# Patient Record
Sex: Female | Born: 1955 | Race: White | Hispanic: No | Marital: Married | Smoking: Never smoker
Health system: Southern US, Community
[De-identification: ages and names within clinical notes are randomized; demographics above are authoritative.]

## PROBLEM LIST (undated history)

## (undated) DIAGNOSIS — N951 Menopausal and female climacteric states: Secondary | ICD-10-CM

## (undated) DIAGNOSIS — K219 Gastro-esophageal reflux disease without esophagitis: Secondary | ICD-10-CM

## (undated) DIAGNOSIS — R030 Elevated blood-pressure reading, without diagnosis of hypertension: Secondary | ICD-10-CM

## (undated) DIAGNOSIS — D649 Anemia, unspecified: Secondary | ICD-10-CM

## (undated) DIAGNOSIS — E785 Hyperlipidemia, unspecified: Secondary | ICD-10-CM

## (undated) DIAGNOSIS — C44310 Basal cell carcinoma of skin of unspecified parts of face: Secondary | ICD-10-CM

## (undated) DIAGNOSIS — Z0000 Encounter for general adult medical examination without abnormal findings: Secondary | ICD-10-CM

## (undated) DIAGNOSIS — R197 Diarrhea, unspecified: Secondary | ICD-10-CM

## (undated) DIAGNOSIS — B019 Varicella without complication: Secondary | ICD-10-CM

## (undated) DIAGNOSIS — R002 Palpitations: Secondary | ICD-10-CM

## (undated) HISTORY — DX: Gastro-esophageal reflux disease without esophagitis: K21.9

## (undated) HISTORY — DX: Elevated blood-pressure reading, without diagnosis of hypertension: R03.0

## (undated) HISTORY — DX: Menopausal and female climacteric states: N95.1

## (undated) HISTORY — DX: Hyperlipidemia, unspecified: E78.5

## (undated) HISTORY — DX: Varicella without complication: B01.9

## (undated) HISTORY — DX: Diarrhea, unspecified: R19.7

## (undated) HISTORY — PX: SKIN CANCER EXCISION: SHX779

## (undated) HISTORY — PX: WISDOM TOOTH EXTRACTION: SHX21

## (undated) HISTORY — PX: TONSILLECTOMY: SHX5217

## (undated) HISTORY — DX: Encounter for general adult medical examination without abnormal findings: Z00.00

## (undated) HISTORY — DX: Palpitations: R00.2

## (undated) HISTORY — DX: Basal cell carcinoma of skin of unspecified parts of face: C44.310

---

## 1999-08-22 ENCOUNTER — Encounter: Admission: RE | Admit: 1999-08-22 | Discharge: 1999-08-22 | Payer: Self-pay | Admitting: Family Medicine

## 1999-08-22 ENCOUNTER — Encounter: Payer: Self-pay | Admitting: Family Medicine

## 2001-10-14 DIAGNOSIS — E785 Hyperlipidemia, unspecified: Secondary | ICD-10-CM

## 2001-10-14 HISTORY — DX: Hyperlipidemia, unspecified: E78.5

## 2002-10-14 DIAGNOSIS — D649 Anemia, unspecified: Secondary | ICD-10-CM

## 2002-10-14 HISTORY — DX: Anemia, unspecified: D64.9

## 2003-09-19 ENCOUNTER — Ambulatory Visit (HOSPITAL_COMMUNITY): Admission: RE | Admit: 2003-09-19 | Discharge: 2003-09-19 | Payer: Self-pay | Admitting: Gastroenterology

## 2003-10-15 LAB — HM COLONOSCOPY: HM Colonoscopy: NORMAL

## 2003-11-01 ENCOUNTER — Ambulatory Visit (HOSPITAL_COMMUNITY): Admission: RE | Admit: 2003-11-01 | Discharge: 2003-11-01 | Payer: Self-pay | Admitting: Gastroenterology

## 2003-11-16 ENCOUNTER — Encounter: Admission: RE | Admit: 2003-11-16 | Discharge: 2003-11-16 | Payer: Self-pay | Admitting: Gastroenterology

## 2004-11-22 ENCOUNTER — Ambulatory Visit (HOSPITAL_COMMUNITY): Admission: RE | Admit: 2004-11-22 | Discharge: 2004-11-22 | Payer: Self-pay | Admitting: Plastic Surgery

## 2004-11-22 ENCOUNTER — Ambulatory Visit (HOSPITAL_BASED_OUTPATIENT_CLINIC_OR_DEPARTMENT_OTHER): Admission: RE | Admit: 2004-11-22 | Discharge: 2004-11-22 | Payer: Self-pay | Admitting: Plastic Surgery

## 2008-06-14 ENCOUNTER — Ambulatory Visit: Payer: Self-pay | Admitting: Hematology

## 2009-08-01 ENCOUNTER — Encounter (HOSPITAL_COMMUNITY): Admission: RE | Admit: 2009-08-01 | Discharge: 2009-10-13 | Payer: Self-pay | Admitting: Family Medicine

## 2011-03-01 NOTE — Op Note (Signed)
NAMECHEMERE, STEFFLER          ACCOUNT NO.:  [id number]   MEDICAL RECORD NO.:  [id number]          PATIENT TYPE:  AMB   LOCATION:  DSC                          FACILITY:  [facility]   PHYSICIAN:  Alfredia Ferguson, M.D.  DATE OF BIRTH:  Jul 08, 1956   DATE OF PROCEDURE:  11/22/2004  DATE OF DISCHARGE:                                 OPERATIVE REPORT   PREOPERATIVE DIAGNOSIS:  Excess fat of the submental area of neck and  bilateral neck.   POSTOPERATIVE DIAGNOSIS:  Excess fat of the submental area of neck and  bilateral neck.   OPERATION PERFORMED:  Suction assisted lipectomy, removing approximately 60  grams of fat.   SURGEON.:  W. Delia Chimes, M.D.   ANESTHESIA:  MAC supplemented with local anesthesia.   INDICATIONS FOR SURGERY:  This is a 55 year old woman who complains of  excess fat in the submental area. She also has some accumulated fat in both  lateral necks. The patient wishes to undergo suction assisted lipectomy.  She was counseled regarding the potential risk of surgery including  temporary or permanent nerve damage, especially to the marginal mandibular  nerve, bleeding, infection, asymmetry, rippling, dimpling, wrinkling of the  skin, nontightening of the skin following the surgery, residual fat, the  need for secondary surgery, temporary numbness of the skin and overall  dissatisfaction with results.  In spite of these and other risks. The  patient wishes to proceed with this surgery. She understands the limitations  of suction assisted lipectomy in that it will not restore her neck to one  that she had when she was very young.   DESCRIPTION OF SURGERY:  Skin marks were placed outlining the limits of  suction in the neck. The patient was then taken to the OR where she was  given intravenous analgesia.  A tumescent solution was infiltrated into the  left and right side of the neck and submental area. This solution consisted  of 250 cc of normal saline plus 1 ampule  of epinephrine 1:1000, plus 50 cc  of 1% Xylocaine plain. A total of 120 cc of this solution was used as the  tumescent solution. The patient's face and neck were then prepped with  Betadine and draped with sterile drapes. After waiting approximately 15  minutes following injection, the neck and submental area were pretunneled  without suction using a 3-mm flat cannula single hole. After pretunneling,  suction was applied and the submental area was suctioned first followed by  the right neck, then the left neck.  Suction went without complication and  with essentially no bleeding. Following the suction, symmetry appeared to be  good and correction of the patient's problem appeared to have been achieved.  Each access incision was closed with a single 5-0 nylon suture. The  patient's face and neck were now cleansed and dried.  [place] foam was placed  on the undersurface of the neck for compression followed by circumferential  wrap around the head and neck. The patient was transferred to recovery room  in satisfactory condition.    WBB/MEDQ  D:  11/22/2004  T:  11/22/2004  Job:  [id number] 

## 2011-03-01 NOTE — Op Note (Signed)
[name], Toni Daniel                    ACCOUNT NO.:  [id number]   MEDICAL RECORD NO.:  [id number]                   PATIENT TYPE:  AMB   LOCATION:  ENDO                                 FACILITY:  [facility]   PHYSICIAN:  Anselmo Rod, M.D.               DATE OF BIRTH:  [birth year]   DATE OF PROCEDURE:  09/19/2003  DATE OF DISCHARGE:                                 OPERATIVE REPORT   PROCEDURE:  Esophagogastroduodenoscopy.   ENDOSCOPIST:  Anselmo Rod, M.D.   INSTRUMENT USED:  Olympus video panendoscope.   INDICATIONS FOR PROCEDURE:  A 55 year old white female with a history of  dysphagia. A barium swallow showed a mild distal esophageal  stricture  versus esophagitis. Dilatation planned if needed.   PREPROCEDURE PREPARATION:  Informed consent was obtained from the patient.  The patient was fasted for 8 hours prior to the procedure.   PREPROCEDURE PHYSICAL:  The patient had stable vital signs, neck supple,  chest clear to auscultation, S1, S2 regular, abdomen soft with normal bowel  sounds.   DESCRIPTION OF PROCEDURE:  The patient was placed in the left lateral  decubitus position and sedated with 70 mg of Demerol, and 7 mg of Versed  intravenously. Once the patient was adequately sedated and maintained on low  flow oxygen and continuous cardiac monitoring, the Olympus video  panendoscope was advanced through the mouth piece over the tongue into the  esophagus under direct visualization.   The entire esophagus appeared normal. The GU junction was widely patent. No  erosions, ulcerations, Barrett's mucosa, Schatzki's ring or stricture were  seen.  The scope was then advanced into the stomach. The entire gastric  mucosa appeared healthy. The retroflex view showed no abnormalities. The  proximal small bowel was normal.   IMPRESSION:  Normal esophagogastroduodenoscopy, no stricture seen.   RECOMMENDATIONS:  1. Continue proton pump inhibitors.  2. Follow anti reflux  measures.  3. Outpatient follow up in the next 4 weeks for further recommendations.                                               Anselmo Rod, M.D.    JNM/MEDQ  D:  09/19/2003  T:  09/19/2003  Job:  [id number]   cc:   Pershing Cox, M.D.  [address]  [address]  [place]  [place] [address]  Fax: [phone]

## 2011-05-15 LAB — HM PAP SMEAR: HM Pap smear: NORMAL

## 2011-06-29 ENCOUNTER — Encounter: Payer: Self-pay | Admitting: *Deleted

## 2011-06-29 ENCOUNTER — Emergency Department (HOSPITAL_BASED_OUTPATIENT_CLINIC_OR_DEPARTMENT_OTHER)
Admission: EM | Admit: 2011-06-29 | Discharge: 2011-06-29 | Disposition: A | Discharge status: Home / Self Care | Payer: 59 | Attending: Emergency Medicine | Admitting: Emergency Medicine

## 2011-06-29 DIAGNOSIS — D509 Iron deficiency anemia, unspecified: Secondary | ICD-10-CM | POA: Insufficient documentation

## 2011-06-29 HISTORY — DX: Anemia, unspecified: D64.9

## 2011-06-29 LAB — CBC
HCT: 29.2 % — ABNORMAL LOW (ref 36.0–46.0)
Hemoglobin: 8.9 g/dL — ABNORMAL LOW (ref 12.0–15.0)
MCH: 22.3 pg — ABNORMAL LOW (ref 26.0–34.0)
MCHC: 30.5 g/dL (ref 30.0–36.0)
MCV: 73 fL — ABNORMAL LOW (ref 78.0–100.0)
Platelets: 197 10*3/uL (ref 150–400)
RBC: 4 MIL/uL (ref 3.87–5.11)
RDW: 16.3 % — ABNORMAL HIGH (ref 11.5–15.5)
WBC: 5.4 10*3/uL (ref 4.0–10.5)

## 2011-06-29 LAB — DIFFERENTIAL
Eosinophils Relative: 2 % (ref 0–5)
Lymphocytes Relative: 33 % (ref 12–46)
Lymphs Abs: 1.8 10*3/uL (ref 0.7–4.0)
Monocytes Absolute: 0.6 10*3/uL (ref 0.1–1.0)
Neutro Abs: 2.9 10*3/uL (ref 1.7–7.7)

## 2011-06-29 LAB — BASIC METABOLIC PANEL
BUN: 11 mg/dL (ref 6–23)
Chloride: 103 mEq/L (ref 96–112)
GFR calc Af Amer: >60 mL/min (ref >60)
GFR calc non Af Amer: >60 mL/min (ref >60)
Potassium: 3.6 mEq/L (ref 3.5–5.1)
Sodium: 139 mEq/L (ref 135–145)

## 2011-06-29 MED ORDER — FERROUS SULFATE 325 (65 FE) MG PO TABS: 325.0000 mg | ORAL_TABLET | Freq: Three times a day (TID) | ORAL | Status: DC

## 2011-06-29 NOTE — ED Notes (Signed)
I called [facility] , MD on call for Dr. Foy Guadalajara. Dr. Renato Gails on call, my initial call was made at 821p, I re paged again at 843 p, and again at 906 p. The call was returned at 922 p by Dr. Renato Gails to Dr. Brooke Dare

## 2011-06-29 NOTE — ED Notes (Signed)
Pt states she was seen at the Dr's office last week and her Hgb was ?9.7 "They wanted to do more tests." Here today because her s/s are worse. Feel dizzy and tired. Extremities feel heavy. C/O H/A.

## 2011-06-29 NOTE — ED Notes (Signed)
Pt states that she was recently seen at her PCP and had a low iron count and has previously had iron transfusions pt reports dizziness tiredness and general malaise x several weeks

## 2011-06-29 NOTE — ED Provider Notes (Signed)
Scribed for Toni Bailiff, MD, the patient was seen in room MH03/MH03 . This chart was scribed by Toni Daniel. This patient's care was started at 7:35 PM.   CSN: [id number] Arrival date & time: 06/29/2011  6:45 PM   Chief Complaint  Patient presents with  . Anemia   (Include location/radiation/quality/duration/timing/severity/associated sxs/prior treatment) HPI Toni Daniel is a 55 y.o. female with a history of anemia presents to the Emergency Department complaining of tiredness and dizziness. Pt reports for the past week she has felt progressively worse with sx of weakness, tiredness, dizziness, HA, and seeing "sparkles."  Pt describes dizziness as feeling like the floor tilts as she walks and the weakness/tiredness as a heaviness in her legs. Pt reports a hist of similar sx when her iron has been low. Pt reports improvement with iron transfusion and has received 2 previous transfusions in 2007 and 2010.  Denies bloody stools.  Currently takes iron supplements but reports no improvement with supplements.  HPI ELEMENTS:  Onset: 1 week ago Timing: intermittent  Quality: "like the flor tilts as she walks"  Modifying factors: aggravated by change in position  Context:  as above  Associated symptoms: HA,, seeing "sparkles, weakness  Past Medical History  Diagnosis Date  . Anemia   . Migraine    History reviewed. No pertinent past surgical history.  History reviewed. No pertinent family history.  History  Substance Use Topics  . Smoking status: Never Smoker   . Smokeless tobacco: Not on file  . Alcohol Use: Yes    Review of Systems 10 Systems reviewed and are negative for acute change except as noted in the HPI.  Allergies  Latex  Home Medications   Current Outpatient Rx  Name Route Sig Dispense Refill  . ATORVASTATIN CALCIUM 80 MG PO TABS Oral Take 80 mg by mouth daily.      Marland Kitchen FERROUS SULFATE 325 (65 FE) MG PO TABS Oral Take 325 mg by mouth daily with breakfast.       . LANSOPRAZOLE 15 MG PO CPDR Oral Take 15 mg by mouth daily.      Marland Kitchen ONE-DAILY MULTI VITAMINS PO TABS Oral Take 1 tablet by mouth 2 (two) times a week.        Physical Exam    BP 155/84  Pulse 80  Temp(Src) 98.1 F (36.7 C) (Oral)  Resp 20  Ht 5\' 5"  (1.651 m)  Wt 139 lb (63.05 kg)  BMI 23.13 kg/m2  SpO2 100%  LMP 06/05/2011  Physical Exam  Nursing note and vitals reviewed. Constitutional: She is oriented to person, place, and time. She appears well-developed and well-nourished. She appears distressed.  HENT:  Head: Normocephalic and atraumatic.  Eyes: Conjunctivae and EOM are normal. Pupils are equal, round, and reactive to light.  Neck: Normal range of motion. Neck supple.  Cardiovascular: Normal rate, regular rhythm and normal heart sounds.   Pulmonary/Chest: Effort normal and breath sounds normal.  Abdominal: Soft. There is no tenderness.  Musculoskeletal: Normal range of motion.  Neurological: She is alert and oriented to person, place, and time.  Skin: Skin is warm and dry.  Psychiatric: She has a normal mood and affect.   Procedures  OTHER DATA REVIEWED: Nursing notes, vital signs, and past medical records reviewed.  DIAGNOSTIC STUDIES: Oxygen Saturation is 100% on room air, normal by my interpretation.    LABS / RADIOLOGY:  Results for orders placed during the hospital encounter of 06/29/11  CBC  Component Value Range   WBC 5.4  4.0 - 10.5 (K/uL)   RBC 4.00  3.87 - 5.11 (MIL/uL)   Hemoglobin 8.9 (*) 12.0 - 15.0 (g/dL)   HCT 16.1 (*) 09.6 - 46.0 (%)   MCV 73.0 (*) 78.0 - 100.0 (fL)   MCH 22.3 (*) 26.0 - 34.0 (pg)   MCHC 30.5  30.0 - 36.0 (g/dL)   RDW 04.5 (*) 40.9 - 15.5 (%)   Platelets 197  150 - 400 (K/uL)  DIFFERENTIAL      Component Value Range   Neutrophils Relative 54  43 - 77 (%)   Neutro Abs 2.9  1.7 - 7.7 (K/uL)   Lymphocytes Relative 33  12 - 46 (%)   Lymphs Abs 1.8  0.7 - 4.0 (K/uL)   Monocytes Relative 11  3 - 12 (%)   Monocytes  Absolute 0.6  0.1 - 1.0 (K/uL)   Eosinophils Relative 2  0 - 5 (%)   Eosinophils Absolute 0.1  0.0 - 0.7 (K/uL)   Basophils Relative 0  0 - 1 (%)   Basophils Absolute 0.0  0.0 - 0.1 (K/uL)    ED COURSE / COORDINATION OF CARE: 20:10 EDP at PT bedside. Discussed diagnostic possibilities including low iron and viral infection. Pt requests iron transfusion. EDP discussed availability issues with a non standard procedure such as iron infusions. Discussed plan to discharge with follow/up instructions. EDP ordered the following: Orders Placed This Encounter  Procedures  . CBC  . Differential  . Basic metabolic panel  21:25 EDP consult with Dr. Azucena Kuba.  21:27 Pt recehck.   MDM: CBC and basic metabolic panel was obtained numerous department. Her hemoglobin is 8.9. This is not well enough to require a transfusion. She has a microcytic anemia likely secondary to her history of iron deficiency anemia. Patient states that when she has had symptoms this severe she has required iron infusion. We're unable to do that at this facility. I increased her dosing of by mouth iron. I discussed her case with the on-call physician for her primary care group. I explained her need for an INR infusion it was decided the patient does not meet criteria for admission. They will arrange an appointment for Monday and seek to arrange infusion for early in the week. Patient provided signs and symptoms for which to return to the emergency department   SCRIBE ATTESTATION: I personally performed the services described in this documentation, which was scribed in my presence. The recorded information has been reviewed and considered.          Toni Bailiff, MD 06/29/11 2205

## 2011-07-05 ENCOUNTER — Ambulatory Visit (HOSPITAL_COMMUNITY): Payer: 59 | Attending: Family Medicine

## 2011-07-05 DIAGNOSIS — D649 Anemia, unspecified: Secondary | ICD-10-CM | POA: Insufficient documentation

## 2011-07-23 ENCOUNTER — Ambulatory Visit: Payer: 59 | Admitting: Hematology & Oncology

## 2011-08-12 ENCOUNTER — Ambulatory Visit: Payer: 59 | Admitting: Hematology & Oncology

## 2011-08-22 ENCOUNTER — Telehealth: Payer: Self-pay | Admitting: Hematology & Oncology

## 2011-08-22 NOTE — Telephone Encounter (Signed)
Talked with Dois Davenport at Dr. Pablo Lawrence office is aware pt was a no show for 10-29 appointment and has not called back to reschedule per note from [facility]

## 2012-05-14 LAB — HM MAMMOGRAPHY: HM Mammogram: NORMAL

## 2012-07-20 ENCOUNTER — Ambulatory Visit (INDEPENDENT_AMBULATORY_CARE_PROVIDER_SITE_OTHER): Payer: BC Managed Care – PPO | Admitting: Family Medicine

## 2012-07-20 ENCOUNTER — Encounter: Payer: Self-pay | Admitting: Family Medicine

## 2012-07-20 VITALS — BP 138/84 | HR 72 | Temp 97.8°F | Ht 65.0 in | Wt 154.8 lb

## 2012-07-20 DIAGNOSIS — K219 Gastro-esophageal reflux disease without esophagitis: Secondary | ICD-10-CM

## 2012-07-20 DIAGNOSIS — R030 Elevated blood-pressure reading, without diagnosis of hypertension: Secondary | ICD-10-CM

## 2012-07-20 DIAGNOSIS — IMO0001 Reserved for inherently not codable concepts without codable children: Secondary | ICD-10-CM

## 2012-07-20 DIAGNOSIS — C44319 Basal cell carcinoma of skin of other parts of face: Secondary | ICD-10-CM

## 2012-07-20 DIAGNOSIS — G43909 Migraine, unspecified, not intractable, without status migrainosus: Secondary | ICD-10-CM

## 2012-07-20 DIAGNOSIS — R197 Diarrhea, unspecified: Secondary | ICD-10-CM

## 2012-07-20 DIAGNOSIS — N951 Menopausal and female climacteric states: Secondary | ICD-10-CM

## 2012-07-20 DIAGNOSIS — E785 Hyperlipidemia, unspecified: Secondary | ICD-10-CM | POA: Insufficient documentation

## 2012-07-20 DIAGNOSIS — R1310 Dysphagia, unspecified: Secondary | ICD-10-CM

## 2012-07-20 DIAGNOSIS — R002 Palpitations: Secondary | ICD-10-CM

## 2012-07-20 DIAGNOSIS — D649 Anemia, unspecified: Secondary | ICD-10-CM

## 2012-07-20 DIAGNOSIS — C44310 Basal cell carcinoma of skin of unspecified parts of face: Secondary | ICD-10-CM

## 2012-07-20 HISTORY — DX: Diarrhea, unspecified: R19.7

## 2012-07-20 HISTORY — DX: Reserved for inherently not codable concepts without codable children: IMO0001

## 2012-07-20 LAB — RENAL FUNCTION PANEL
Albumin: 3.9 g/dL (ref 3.5–5.2)
BUN: 15 mg/dL (ref 6–23)
CO2: 29 mEq/L (ref 19–32)
Calcium: 9 mg/dL (ref 8.4–10.5)
Chloride: 104 mEq/L (ref 96–112)
Creatinine, Ser: 0.6 mg/dL (ref 0.4–1.2)

## 2012-07-20 LAB — IRON AND TIBC
%SAT: 24 % (ref 20–55)
Iron: 90 ug/dL (ref 42–145)

## 2012-07-20 LAB — HEPATIC FUNCTION PANEL
ALT: 22 U/L (ref 0–35)
AST: 22 U/L (ref 0–37)
Albumin: 3.9 g/dL (ref 3.5–5.2)
Alkaline Phosphatase: 66 U/L (ref 39–117)

## 2012-07-20 LAB — CBC
HCT: 41.1 % (ref 36.0–46.0)
Hemoglobin: 13.4 g/dL (ref 12.0–15.0)
Platelets: 163 10*3/uL (ref 150.0–400.0)
RBC: 4.52 Mil/uL (ref 3.87–5.11)
WBC: 4.6 10*3/uL (ref 4.5–10.5)

## 2012-07-20 LAB — LIPID PANEL
Cholesterol: 192 mg/dL (ref 0–200)
VLDL: 26.6 mg/dL (ref 0.0–40.0)

## 2012-07-20 LAB — TSH: TSH: 0.99 u[IU]/mL (ref 0.35–5.50)

## 2012-07-20 MED ORDER — ATORVASTATIN CALCIUM 80 MG PO TABS: 80.0000 mg | ORAL_TABLET | Freq: Every day | ORAL | Status: DC

## 2012-07-20 MED ORDER — RANITIDINE HCL 300 MG PO TABS: 300.0000 mg | ORAL_TABLET | Freq: Every day | ORAL | Status: DC

## 2012-07-20 NOTE — Assessment & Plan Note (Addendum)
Has switched back from OTC Prevacid 15 mg back to the Nexium 40 mg that works better but she still has breakthrough symptoms, avoid offending foods, add Ranitidine 300 mg po qhs. Referred to GI for further evaluation

## 2012-07-20 NOTE — Assessment & Plan Note (Signed)
Improved on recheck, avoid sodium consider DASH diet. Recheck at next visit.

## 2012-07-20 NOTE — Assessment & Plan Note (Signed)
Needs dermatologist agrees to referral at her next visit. Encouraged sunscreen

## 2012-07-20 NOTE — Progress Notes (Signed)
Patient ID: Toni Daniel, female   DOB: [birth year], 56 y.o.   MRN: [id number] Toni Daniel [id number] 08-10-1956 07/20/2012      Progress Note-Follow Up  Subjective  Chief Complaint  Chief Complaint  Patient presents with  . Establish Care    new patient- would like labs done- fasting    HPI  Patient is a 56 year old Caucasian female who is in today for new patient appointment. She is struggling with persistent dysphagia and heartburn. She's had trouble for many years and eventually have an upper endoscopy in 2003. She notes little catch intermittently while eating. She does take Nexium which she has just restarted was using Prevacid but that was working as well. She is struggling with perimenopausal symptoms with hot flashes, palpitations and fatigue. She is also frustrated with weight gain and fuzzy thinking. She is following with her OB/GYN and contemplating trying hormones. She struggles palpitations intermittently and describes a fluttering sensation and a small cough in her throat. This has not worsened recently and they do not last for any extended period of time. She has no associated shortness or breath, chest pain or other concerns are that. She has a long history of recurrent anemia requiring iron transfusions at times. She reports she's had 2 was a long and 1 to a study at [facility]. She was having a lot more trouble with his/her when she was having heavy periods but now she's begun skipping. She is feeling better. She did have pica but that has resolved. No recent illness, fevers, chills, chest pain, GI or GU complaints otherwise noted  Past Medical History  Diagnosis Date  . Migraine   . Chicken pox as a child  . Anemia 2004  . Hyperlipidemia 2003  . GERD (gastroesophageal reflux disease)   . Perimenopause   . Heart palpitations   . Diarrhea 07/20/2012  . BCC (basal cell carcinoma), face   . Elevated BP 07/20/2012    Past Surgical History  Procedure Date  .  Cesarean section 1986 and 1990  . Tonsillectomy 16  . Wisdom tooth extraction 56 yrs old  . Skin cancer excision     2 BCC from face    Family History  Problem Relation Age of Onset  . Stroke Mother     post- operative- stroke  . Other Mother     mass in uterus- unsure if ca  . Hyperlipidemia Mother   . Heart disease Father   . COPD Father     smoked when he was young- hadn't smoked in 40 yrs  . Hyperlipidemia Sister   . Heart disease Sister   . Cancer Maternal Grandmother     gall bladder  . Pneumonia Paternal Grandfather   . Cancer Sister     breast or uterine?  Marland Kitchen Asthma Son     History   Social History  . Marital Status: Married    Spouse Name: N/A    Number of Children: N/A  . Years of Education: N/A   Occupational History  . Not on file.   Social History Main Topics  . Smoking status: Never Smoker   . Smokeless tobacco: Never Used  . Alcohol Use: Yes     3-4 glasses of wine weekly  . Drug Use: No  . Sexually Active: Yes -- Female partner(s)   Other Topics Concern  . Not on file   Social History Narrative  . No narrative on file    Current Outpatient Prescriptions on File  Prior to Visit  Medication Sig Dispense Refill  . esomeprazole (NEXIUM) 40 MG capsule Take 40 mg by mouth daily before breakfast.      . ferrous sulfate 325 (65 FE) MG tablet Take 325 mg by mouth daily with breakfast.        . lansoprazole (PREVACID) 15 MG capsule Take 15 mg by mouth as needed.       . Multiple Vitamin (MULTIVITAMIN) tablet Take 1 tablet by mouth 2 (two) times a week.        Marland Kitchen DISCONTD: atorvastatin (LIPITOR) 80 MG tablet Take 80 mg by mouth daily.        . ferrous sulfate 325 (65 FE) MG tablet Take 1 tablet (325 mg total) by mouth 3 (three) times daily with meals.  30 tablet  0  . ranitidine (ZANTAC) 300 MG tablet Take 1 tablet (300 mg total) by mouth at bedtime.  30 tablet  5  . SUMAtriptan (IMITREX) 50 MG tablet Take 50 mg by mouth every 2 (two) hours as needed.         Allergies  Allergen Reactions  . Latex Rash    Review of Systems  Review of Systems  Constitutional: Negative for fever and malaise/fatigue.  HENT: Negative for congestion.   Eyes: Negative for discharge.  Respiratory: Positive for cough. Negative for shortness of breath.   Cardiovascular: Negative for chest pain, palpitations and leg swelling.  Gastrointestinal: Positive for heartburn. Negative for nausea, vomiting, abdominal pain and diarrhea.  Genitourinary: Negative for dysuria.  Musculoskeletal: Negative for falls.  Skin: Negative for rash.  Neurological: Positive for headaches. Negative for loss of consciousness.  Endo/Heme/Allergies: Negative for polydipsia.  Psychiatric/Behavioral: Negative for depression and suicidal ideas. The patient is not nervous/anxious and does not have insomnia.     Objective  BP 138/84  Pulse 72  Temp 97.8 F (36.6 C) (Temporal)  Ht 5\' 5"  (1.651 m)  Wt 154 lb 12.8 oz (70.217 kg)  BMI 25.76 kg/m2  SpO2 98%  LMP 05/14/2012  Physical Exam  Physical Exam  Constitutional: She is oriented to person, place, and time and well-developed, well-nourished, and in no distress. No distress.  HENT:  Head: Normocephalic and atraumatic.  Eyes: Conjunctivae normal are normal.  Neck: Neck supple. No thyromegaly present.  Cardiovascular: Normal rate, regular rhythm and normal heart sounds.   No murmur heard. Pulmonary/Chest: Effort normal and breath sounds normal. She has no wheezes.  Abdominal: Soft. Bowel sounds are normal. She exhibits no distension and no mass. There is no tenderness. There is no rebound and no guarding.  Musculoskeletal: She exhibits no edema.  Lymphadenopathy:    She has no cervical adenopathy.  Neurological: She is alert and oriented to person, place, and time.  Skin: Skin is warm and dry. No rash noted. She is not diaphoretic.  Psychiatric: Memory, affect and judgment normal.    Lab Results  Component Value Date    TSH 0.99 07/20/2012   Lab Results  Component Value Date   WBC 4.6 07/20/2012   HGB 13.4 07/20/2012   HCT 41.1 07/20/2012   MCV 91.0 07/20/2012   PLT 163.0 07/20/2012   Lab Results  Component Value Date   CREATININE 0.6 07/20/2012   BUN 15 07/20/2012   NA 139 07/20/2012   K 4.3 07/20/2012   CL 104 07/20/2012   CO2 29 07/20/2012   Lab Results  Component Value Date   ALT 22 07/20/2012   AST 22 07/20/2012   ALKPHOS 66  07/20/2012   BILITOT 0.7 07/20/2012   Lab Results  Component Value Date   CHOL 192 07/20/2012   Lab Results  Component Value Date   HDL 49.80 07/20/2012   Lab Results  Component Value Date   LDLCALC 116* 07/20/2012   Lab Results  Component Value Date   TRIG 133.0 07/20/2012   Lab Results  Component Value Date   CHOLHDL 4 07/20/2012     Assessment & Plan  Heart palpitations Avoid caffeine and report if symptoms worsen, check Thyroid  GERD (gastroesophageal reflux disease) Has switched back from OTC Prevacid 15 mg back to the Nexium 40 mg that works better but she still has breakthrough symptoms, avoid offending foods, add Ranitidine 300 mg po qhs.   Elevated BP Improved on recheck, avoid sodium consider DASH diet. Recheck at next visit.  Anemia Patient reports 3 separate episodes of iron infusions in past. Repeat cbc, iron studies. She was having menstrual cycles at that time. No longer having cycles.   BCC (basal cell carcinoma), face Needs dermatologist agrees to referral at her next visit. Encouraged sunscreen  Diarrhea Start probiotic and consider further testing if persists  Hyperlipidemia Check lipid panel, add MegaRed  Migraine Using Imitrex prn

## 2012-07-20 NOTE — Assessment & Plan Note (Signed)
Using Imitrex prn

## 2012-07-20 NOTE — Assessment & Plan Note (Signed)
Start probiotic and consider further testing if persists

## 2012-07-20 NOTE — Assessment & Plan Note (Signed)
Patient reports 3 separate episodes of iron infusions in past. Repeat cbc, iron studies. She was having menstrual cycles at that time. No longer having cycles.

## 2012-07-20 NOTE — Assessment & Plan Note (Signed)
Check lipid panel, add MegaRed

## 2012-07-20 NOTE — Assessment & Plan Note (Addendum)
Avoid caffeine and report if symptoms worsen, check Thyroid. EKG normal today

## 2012-07-20 NOTE — Patient Instructions (Addendum)
Preventive Care for Adults, Female A healthy lifestyle and preventive care can promote health and wellness. Preventive health guidelines for women include the following key practices.  A routine yearly physical is a good way to check with your caregiver about your health and preventive screening. It is a chance to share any concerns and updates on your health, and to receive a thorough exam.  Visit your dentist for a routine exam and preventive care every 6 months. Brush your teeth twice a day and floss once a day. Good oral hygiene prevents tooth decay and gum disease.  The frequency of eye exams is based on your age, health, family medical history, use of contact lenses, and other factors. Follow your caregiver's recommendations for frequency of eye exams.  Eat a healthy diet. Foods like vegetables, fruits, whole grains, low-fat dairy products, and lean protein foods contain the nutrients you need without too many calories. Decrease your intake of foods high in solid fats, added sugars, and salt. Eat the right amount of calories for you.Get information about a proper diet from your caregiver, if necessary.  Regular physical exercise is one of the most important things you can do for your health. Most adults should get at least 150 minutes of moderate-intensity exercise (any activity that increases your heart rate and causes you to sweat) each week. In addition, most adults need muscle-strengthening exercises on 2 or more days a week.  Maintain a healthy weight. The body mass index (BMI) is a screening tool to identify possible weight problems. It provides an estimate of body fat based on height and weight. Your caregiver can help determine your BMI, and can help you achieve or maintain a healthy weight.For adults 20 years and older:  A BMI below 18.5 is considered underweight.  A BMI of 18.5 to 24.9 is normal.  A BMI of 25 to 29.9 is considered overweight.  A BMI of 30 and above is  considered obese.  Maintain normal blood lipids and cholesterol levels by exercising and minimizing your intake of saturated fat. Eat a balanced diet with plenty of fruit and vegetables. Blood tests for lipids and cholesterol should begin at age 20 and be repeated every 5 years. If your lipid or cholesterol levels are high, you are over 50, or you are at high risk for heart disease, you may need your cholesterol levels checked more frequently.Ongoing high lipid and cholesterol levels should be treated with medicines if diet and exercise are not effective.  If you smoke, find out from your caregiver how to quit. If you do not use tobacco, do not start.  If you are pregnant, do not drink alcohol. If you are breastfeeding, be very cautious about drinking alcohol. If you are not pregnant and choose to drink alcohol, do not exceed 1 drink per day. One drink is considered to be 12 ounces (355 mL) of beer, 5 ounces (148 mL) of wine, or 1.5 ounces (44 mL) of liquor.  Avoid use of street drugs. Do not share needles with anyone. Ask for help if you need support or instructions about stopping the use of drugs.  High blood pressure causes heart disease and increases the risk of stroke. Your blood pressure should be checked at least every 1 to 2 years. Ongoing high blood pressure should be treated with medicines if weight loss and exercise are not effective.  If you are 55 to 58 years old, ask your caregiver if you should take aspirin to prevent strokes.  Diabetes   screening involves taking a blood sample to check your fasting blood sugar level. This should be done once every 3 years, after age 45, if you are within normal weight and without risk factors for diabetes. Testing should be considered at a younger age or be carried out more frequently if you are overweight and have at least 1 risk factor for diabetes.  Breast cancer screening is essential preventive care for women. You should practice "breast  self-awareness." This means understanding the normal appearance and feel of your breasts and may include breast self-examination. Any changes detected, no matter how small, should be reported to a caregiver. Women in their 20s and 30s should have a clinical breast exam (CBE) by a caregiver as part of a regular health exam every 1 to 3 years. After age 40, women should have a CBE every year. Starting at age 40, women should consider having a mammography (breast X-ray test) every year. Women who have a family history of breast cancer should talk to their caregiver about genetic screening. Women at a high risk of breast cancer should talk to their caregivers about having magnetic resonance imaging (MRI) and a mammography every year.  The Pap test is a screening test for cervical cancer. A Pap test can show cell changes on the cervix that might become cervical cancer if left untreated. A Pap test is a procedure in which cells are obtained and examined from the lower end of the uterus (cervix).  Women should have a Pap test starting at age 21.  Between ages 21 and 29, Pap tests should be repeated every 2 years.  Beginning at age 30, you should have a Pap test every 3 years as long as the past 3 Pap tests have been normal.  Some women have medical problems that increase the chance of getting cervical cancer. Talk to your caregiver about these problems. It is especially important to talk to your caregiver if a new problem develops soon after your last Pap test. In these cases, your caregiver may recommend more frequent screening and Pap tests.  The above recommendations are the same for women who have or have not gotten the vaccine for human papillomavirus (HPV).  If you had a hysterectomy for a problem that was not cancer or a condition that could lead to cancer, then you no longer need Pap tests. Even if you no longer need a Pap test, a regular exam is a good idea to make sure no other problems are  starting.  If you are between ages 65 and 70, and you have had normal Pap tests going back 10 years, you no longer need Pap tests. Even if you no longer need a Pap test, a regular exam is a good idea to make sure no other problems are starting.  If you have had past treatment for cervical cancer or a condition that could lead to cancer, you need Pap tests and screening for cancer for at least 20 years after your treatment.  If Pap tests have been discontinued, risk factors (such as a new sexual partner) need to be reassessed to determine if screening should be resumed.  The HPV test is an additional test that may be used for cervical cancer screening. The HPV test looks for the virus that can cause the cell changes on the cervix. The cells collected during the Pap test can be tested for HPV. The HPV test could be used to screen women aged 30 years and older, and should   be used in women of any age who have unclear Pap test results. After the age of 30, women should have HPV testing at the same frequency as a Pap test.  Colorectal cancer can be detected and often prevented. Most routine colorectal cancer screening begins at the age of 50 and continues through age 75. However, your caregiver may recommend screening at an earlier age if you have risk factors for colon cancer. On a yearly basis, your caregiver may provide home test kits to check for hidden blood in the stool. Use of a small camera at the end of a tube, to directly examine the colon (sigmoidoscopy or colonoscopy), can detect the earliest forms of colorectal cancer. Talk to your caregiver about this at age 50, when routine screening begins. Direct examination of the colon should be repeated every 5 to 10 years through age 75, unless early forms of pre-cancerous polyps or small growths are found.  Hepatitis C blood testing is recommended for all people born from 1945 through 1965 and any individual with known risks for hepatitis C.  Practice  safe sex. Use condoms and avoid high-risk sexual practices to reduce the spread of sexually transmitted infections (STIs). STIs include gonorrhea, chlamydia, syphilis, trichomonas, herpes, HPV, and human immunodeficiency virus (HIV). Herpes, HIV, and HPV are viral illnesses that have no cure. They can result in disability, cancer, and death. Sexually active women aged 25 and younger should be checked for chlamydia. Older women with new or multiple partners should also be tested for chlamydia. Testing for other STIs is recommended if you are sexually active and at increased risk.  Osteoporosis is a disease in which the bones lose minerals and strength with aging. This can result in serious bone fractures. The risk of osteoporosis can be identified using a bone density scan. Women ages 65 and over and women at risk for fractures or osteoporosis should discuss screening with their caregivers. Ask your caregiver whether you should take a calcium supplement or vitamin D to reduce the rate of osteoporosis.  Menopause can be associated with physical symptoms and risks. Hormone replacement therapy is available to decrease symptoms and risks. You should talk to your caregiver about whether hormone replacement therapy is right for you.  Use sunscreen with sun protection factor (SPF) of 30 or more. Apply sunscreen liberally and repeatedly throughout the day. You should seek shade when your shadow is shorter than you. Protect yourself by wearing long sleeves, pants, a wide-brimmed hat, and sunglasses year round, whenever you are outdoors.  Once a month, do a whole body skin exam, using a mirror to look at the skin on your back. Notify your caregiver of new moles, moles that have irregular borders, moles that are larger than a pencil eraser, or moles that have changed in shape or color.  Stay current with required immunizations.  Influenza. You need a dose every fall (or winter). The composition of the flu vaccine  changes each year, so being vaccinated once is not enough.  Pneumococcal polysaccharide. You need 1 to 2 doses if you smoke cigarettes or if you have certain chronic medical conditions. You need 1 dose at age 65 (or older) if you have never been vaccinated.  Tetanus, diphtheria, pertussis (Tdap, Td). Get 1 dose of Tdap vaccine if you are younger than age 65, are over 65 and have contact with an infant, are a healthcare worker, are pregnant, or simply want to be protected from whooping cough. After that, you need a Td   booster dose every 10 years. Consult your caregiver if you have not had at least 3 tetanus and diphtheria-containing shots sometime in your life or have a deep or dirty wound.  HPV. You need this vaccine if you are a woman age 26 or younger. The vaccine is given in 3 doses over 6 months.  Measles, mumps, rubella (MMR). You need at least 1 dose of MMR if you were born in 1957 or later. You may also need a second dose.  Meningococcal. If you are age 19 to 21 and a first-year college student living in a residence hall, or have one of several medical conditions, you need to get vaccinated against meningococcal disease. You may also need additional booster doses.  Zoster (shingles). If you are age 60 or older, you should get this vaccine.  Varicella (chickenpox). If you have never had chickenpox or you were vaccinated but received only 1 dose, talk to your caregiver to find out if you need this vaccine.  Hepatitis A. You need this vaccine if you have a specific risk factor for hepatitis A virus infection or you simply wish to be protected from this disease. The vaccine is usually given as 2 doses, 6 to 18 months apart.  Hepatitis B. You need this vaccine if you have a specific risk factor for hepatitis B virus infection or you simply wish to be protected from this disease. The vaccine is given in 3 doses, usually over 6 months. Preventive Services / Frequency Ages 19 to 39  Blood  pressure check.** / Every 1 to 2 years.  Lipid and cholesterol check.** / Every 5 years beginning at age 20.  Clinical breast exam.** / Every 3 years for women in their 20s and 30s.  Pap test.** / Every 2 years from ages 21 through 29. Every 3 years starting at age 30 through age 65 or 70 with a history of 3 consecutive normal Pap tests.  HPV screening.** / Every 3 years from ages 30 through ages 65 to 70 with a history of 3 consecutive normal Pap tests.  Hepatitis C blood test.** / For any individual with known risks for hepatitis C.  Skin self-exam. / Monthly.  Influenza immunization.** / Every year.  Pneumococcal polysaccharide immunization.** / 1 to 2 doses if you smoke cigarettes or if you have certain chronic medical conditions.  Tetanus, diphtheria, pertussis (Tdap, Td) immunization. / A one-time dose of Tdap vaccine. After that, you need a Td booster dose every 10 years.  HPV immunization. / 3 doses over 6 months, if you are 26 and younger.  Measles, mumps, rubella (MMR) immunization. / You need at least 1 dose of MMR if you were born in 1957 or later. You may also need a second dose.  Meningococcal immunization. / 1 dose if you are age 19 to 21 and a first-year college student living in a residence hall, or have one of several medical conditions, you need to get vaccinated against meningococcal disease. You may also need additional booster doses.  Varicella immunization.** / Consult your caregiver.  Hepatitis A immunization.** / Consult your caregiver. 2 doses, 6 to 18 months apart.  Hepatitis B immunization.** / Consult your caregiver. 3 doses usually over 6 months. Ages 40 to 64  Blood pressure check.** / Every 1 to 2 years.  Lipid and cholesterol check.** / Every 5 years beginning at age 20.  Clinical breast exam.** / Every year after age 40.  Mammogram.** / Every year beginning at age 40   and continuing for as long as you are in good health. Consult with your  caregiver.  Pap test.** / Every 3 years starting at age 30 through age 65 or 70 with a history of 3 consecutive normal Pap tests.  HPV screening.** / Every 3 years from ages 30 through ages 65 to 70 with a history of 3 consecutive normal Pap tests.  Fecal occult blood test (FOBT) of stool. / Every year beginning at age 50 and continuing until age 75. You may not need to do this test if you get a colonoscopy every 10 years.  Flexible sigmoidoscopy or colonoscopy.** / Every 5 years for a flexible sigmoidoscopy or every 10 years for a colonoscopy beginning at age 50 and continuing until age 75.  Hepatitis C blood test.** / For all people born from 1945 through 1965 and any individual with known risks for hepatitis C.  Skin self-exam. / Monthly.  Influenza immunization.** / Every year.  Pneumococcal polysaccharide immunization.** / 1 to 2 doses if you smoke cigarettes or if you have certain chronic medical conditions.  Tetanus, diphtheria, pertussis (Tdap, Td) immunization.** / A one-time dose of Tdap vaccine. After that, you need a Td booster dose every 10 years.  Measles, mumps, rubella (MMR) immunization. / You need at least 1 dose of MMR if you were born in 1957 or later. You may also need a second dose.  Varicella immunization.** / Consult your caregiver.  Meningococcal immunization.** / Consult your caregiver.  Hepatitis A immunization.** / Consult your caregiver. 2 doses, 6 to 18 months apart.  Hepatitis B immunization.** / Consult your caregiver. 3 doses, usually over 6 months. Ages 65 and over  Blood pressure check.** / Every 1 to 2 years.  Lipid and cholesterol check.** / Every 5 years beginning at age 20.  Clinical breast exam.** / Every year after age 40.  Mammogram.** / Every year beginning at age 40 and continuing for as long as you are in good health. Consult with your caregiver.  Pap test.** / Every 3 years starting at age 30 through age 65 or 70 with a 3  consecutive normal Pap tests. Testing can be stopped between 65 and 70 with 3 consecutive normal Pap tests and no abnormal Pap or HPV tests in the past 10 years.  HPV screening.** / Every 3 years from ages 30 through ages 65 or 70 with a history of 3 consecutive normal Pap tests. Testing can be stopped between 65 and 70 with 3 consecutive normal Pap tests and no abnormal Pap or HPV tests in the past 10 years.  Fecal occult blood test (FOBT) of stool. / Every year beginning at age 50 and continuing until age 75. You may not need to do this test if you get a colonoscopy every 10 years.  Flexible sigmoidoscopy or colonoscopy.** / Every 5 years for a flexible sigmoidoscopy or every 10 years for a colonoscopy beginning at age 50 and continuing until age 75.  Hepatitis C blood test.** / For all people born from 1945 through 1965 and any individual with known risks for hepatitis C.  Osteoporosis screening.** / A one-time screening for women ages 65 and over and women at risk for fractures or osteoporosis.  Skin self-exam. / Monthly.  Influenza immunization.** / Every year.  Pneumococcal polysaccharide immunization.** / 1 dose at age 65 (or older) if you have never been vaccinated.  Tetanus, diphtheria, pertussis (Tdap, Td) immunization. / A one-time dose of Tdap vaccine if you are over   65 and have contact with an infant, are a Research scientist (physical sciences), or simply want to be protected from whooping cough. After that, you need a Td booster dose every 10 years.  Varicella immunization.** / Consult your caregiver.  Meningococcal immunization.** / Consult your caregiver.  Hepatitis A immunization.** / Consult your caregiver. 2 doses, 6 to 18 months apart.  Hepatitis B immunization.** / Check with your caregiver. 3 doses, usually over 6 months. ** Family history and personal history of risk and conditions may change your caregiver's recommendations. Document Released: 11/26/2001 Document Revised: 12/23/2011  Document Reviewed: 02/25/2011 [facility] Patient Information 2013 [place], [place].   Krill oil such as MegaRed caps, 1 daily by [employer]

## 2012-07-20 NOTE — Assessment & Plan Note (Signed)
Encouraged good exercise, healthy diet, start Megared caps daily. Is considering hormones with her gyn

## 2012-07-21 NOTE — Telephone Encounter (Signed)
Please advise mychart question? 

## 2012-07-21 NOTE — Telephone Encounter (Signed)
Please advise 

## 2012-08-06 ENCOUNTER — Telehealth: Payer: Self-pay

## 2012-08-06 NOTE — Telephone Encounter (Signed)
Patient called and left a message stating that MD told her during office visit that if pt wanted to change cholesterol medication to call back and let us know? Pt would like new cholesterol medication called into [facility]. Please advise?

## 2012-08-06 NOTE — Telephone Encounter (Signed)
OK to d/c Lipitor. Send in Crestor 40 mg po qhs, disp #30, 3 rf, check lipids in 3 months

## 2012-08-07 MED ORDER — ROSUVASTATIN CALCIUM 40 MG PO TABS: 40.0000 mg | ORAL_TABLET | Freq: Every day | ORAL | Status: DC

## 2012-08-07 NOTE — Telephone Encounter (Signed)
Sent RX to pharmacy and left a detailed message on cell phone

## 2012-08-24 ENCOUNTER — Ambulatory Visit: Payer: BC Managed Care – PPO | Admitting: Cardiovascular Disease

## 2012-09-07 ENCOUNTER — Other Ambulatory Visit: Payer: Self-pay

## 2012-09-07 MED ORDER — ESOMEPRAZOLE MAGNESIUM 40 MG PO CPDR: 40.0000 mg | DELAYED_RELEASE_CAPSULE | Freq: Every day | ORAL | Status: DC

## 2012-09-08 ENCOUNTER — Encounter: Payer: Self-pay | Admitting: Internal Medicine

## 2012-09-17 ENCOUNTER — Ambulatory Visit: Payer: BC Managed Care – PPO | Admitting: Cardiovascular Disease

## 2012-09-22 ENCOUNTER — Other Ambulatory Visit: Payer: Self-pay

## 2012-09-22 MED ORDER — TRETINOIN 0.025 % EX CREA: TOPICAL_CREAM | Freq: Every day | CUTANEOUS | Status: AC

## 2012-09-22 MED ORDER — ROSUVASTATIN CALCIUM 40 MG PO TABS: 40.0000 mg | ORAL_TABLET | Freq: Every day | ORAL | Status: DC

## 2012-09-22 MED ORDER — RANITIDINE HCL 300 MG PO TABS: 300.0000 mg | ORAL_TABLET | Freq: Every day | ORAL | Status: DC

## 2012-09-30 DIAGNOSIS — L709 Acne, unspecified: Secondary | ICD-10-CM | POA: Insufficient documentation

## 2012-10-01 ENCOUNTER — Ambulatory Visit: Payer: BC Managed Care – PPO | Admitting: Internal Medicine

## 2012-10-21 ENCOUNTER — Ambulatory Visit: Payer: BC Managed Care – PPO | Admitting: Family Medicine

## 2012-11-10 ENCOUNTER — Ambulatory Visit: Payer: BC Managed Care – PPO | Admitting: Cardiovascular Disease

## 2012-11-28 ENCOUNTER — Other Ambulatory Visit: Payer: Self-pay

## 2013-03-01 ENCOUNTER — Other Ambulatory Visit: Payer: Self-pay

## 2013-03-01 MED ORDER — ROSUVASTATIN CALCIUM 40 MG PO TABS: 40.0000 mg | ORAL_TABLET | Freq: Every day | ORAL | Status: DC

## 2013-03-01 MED ORDER — ESOMEPRAZOLE MAGNESIUM 40 MG PO CPDR: 40.0000 mg | DELAYED_RELEASE_CAPSULE | Freq: Every day | ORAL | Status: AC

## 2013-03-24 ENCOUNTER — Telehealth: Payer: Self-pay

## 2013-03-24 NOTE — Telephone Encounter (Signed)
Have her try Livalo 2 mg po daily and ask her if she has tried other statins in past and document if failed those too for insurance purposes. Give her some samples of the Livalo to see if she tolerates if we have them. If not disp #30 with 3 rf. Have her use coQ10 daily OTC that might help the aching

## 2013-03-24 NOTE — Telephone Encounter (Signed)
Pt left a message stating that she stopped taking her Crestor 40 mg due to being achy and stiff (ok now that she is no longer taking).   Pt would like to have a new cholesterol medication sent to pharmacy?  Please advise?

## 2013-03-24 NOTE — Telephone Encounter (Signed)
Left a message for patient to return my call.  No samples in closet

## 2013-03-25 ENCOUNTER — Encounter: Payer: Self-pay | Admitting: Family Medicine

## 2013-03-25 ENCOUNTER — Ambulatory Visit (INDEPENDENT_AMBULATORY_CARE_PROVIDER_SITE_OTHER): Payer: 59 | Admitting: Family Medicine

## 2013-03-25 VITALS — BP 122/80 | HR 67 | Temp 98.3°F | Ht 65.0 in | Wt 153.1 lb

## 2013-03-25 DIAGNOSIS — R030 Elevated blood-pressure reading, without diagnosis of hypertension: Secondary | ICD-10-CM

## 2013-03-25 DIAGNOSIS — R002 Palpitations: Secondary | ICD-10-CM

## 2013-03-25 DIAGNOSIS — K219 Gastro-esophageal reflux disease without esophagitis: Secondary | ICD-10-CM

## 2013-03-25 DIAGNOSIS — E785 Hyperlipidemia, unspecified: Secondary | ICD-10-CM

## 2013-03-25 DIAGNOSIS — N951 Menopausal and female climacteric states: Secondary | ICD-10-CM

## 2013-03-25 DIAGNOSIS — E663 Overweight: Secondary | ICD-10-CM

## 2013-03-25 LAB — HEPATIC FUNCTION PANEL
ALT: 22 U/L (ref 0–35)
AST: 20 U/L (ref 0–37)
Albumin: 4.6 g/dL (ref 3.5–5.2)
Bilirubin, Direct: 0.1 mg/dL (ref 0.0–0.3)
Total Bilirubin: 0.5 mg/dL (ref 0.3–1.2)

## 2013-03-25 LAB — RENAL FUNCTION PANEL
Albumin: 4.6 g/dL (ref 3.5–5.2)
CO2: 29 mEq/L (ref 19–32)
Calcium: 9.5 mg/dL (ref 8.4–10.5)
Creat: 0.81 mg/dL (ref 0.50–1.10)
Glucose, Bld: 110 mg/dL — ABNORMAL HIGH (ref 70–99)
Sodium: 139 mEq/L (ref 135–145)

## 2013-03-25 LAB — CBC
Platelets: 203 10*3/uL (ref 150–400)
RBC: 4.99 MIL/uL (ref 3.87–5.11)
RDW: 13.4 % (ref 11.5–15.5)
WBC: 4.8 10*3/uL (ref 4.0–10.5)

## 2013-03-25 LAB — LIPID PANEL
HDL: 50 mg/dL (ref >39)
LDL Cholesterol: 181 mg/dL — ABNORMAL HIGH (ref 0–99)
Total CHOL/HDL Ratio: 5.2 Ratio
Triglycerides: 140 mg/dL (ref <150)

## 2013-03-25 MED ORDER — PANTOPRAZOLE SODIUM 40 MG PO TBEC: 40.0000 mg | DELAYED_RELEASE_TABLET | Freq: Every day | ORAL | Status: DC

## 2013-03-25 MED ORDER — PITAVASTATIN CALCIUM 1 MG PO TABS: 1.0000 mg | ORAL_TABLET | Freq: Every day | ORAL | Status: DC

## 2013-03-25 NOTE — Patient Instructions (Addendum)
Next visit, annual lipid, renal, cbc, hepatic, tsh  Hypercholesterolemia High Blood Cholesterol Cholesterol is a white, waxy, fat-like protein needed by your body in small amounts. The liver makes all the cholesterol you need. It is carried from the liver by the blood through the blood vessels. Deposits (plaque) may build up on blood vessel walls. This makes the arteries narrower and stiffer. Plaque increases the risk for heart attack and stroke. You cannot feel your cholesterol level even if it is very high. The only way to know is by a blood test to check your lipid (fats) levels. Once you know your cholesterol levels, you should keep a record of the test results. Work with your caregiver to to keep your levels in the desired range. WHAT THE RESULTS MEAN:  Total cholesterol is a rough measure of all the cholesterol in your blood.  LDL is the so-called bad cholesterol. This is the type that deposits cholesterol in the walls of the arteries. You want this level to be low.  HDL is the good cholesterol because it cleans the arteries and carries the LDL away. You want this level to be high.  Triglycerides are fat that the body can either burn for energy or store. High levels are closely linked to heart disease. DESIRED LEVELS:  Total cholesterol below 200.  LDL below 100 for people at risk, below 70 for very high risk.  HDL above 50 is good, above 60 is best.  Triglycerides below 150. HOW TO LOWER YOUR CHOLESTEROL:  Diet.  Choose fish or white meat chicken and [place], roasted or baked. Limit fatty cuts of red meat, fried foods, and processed meats, such as sausage and lunch meat.  Eat lots of fresh fruits and vegetables. Choose whole grains, beans, pasta, potatoes and cereals.  Use only small amounts of olive, corn or canola oils. Avoid butter, mayonnaise, shortening or palm kernel oils. Avoid foods with trans-fats.  Use skim/nonfat milk and low-fat/nonfat yogurt and cheeses. Avoid  whole milk, cream, ice cream, egg yolks and cheeses. Healthy desserts include angel food cake, gingersnaps, animal crackers, hard candy, popsicles, and low-fat/nonfat frozen yogurt. Avoid pastries, cakes, pies and cookies.  Exercise.  A regular program helps decrease LDL and raises HDL.  Helps with weight control.  Do things that increase your activity level like gardening, walking, or taking the stairs.  Medication.  May be prescribed by your caregiver to help lowering cholesterol and the risk for heart disease.  You may need medicine even if your levels are normal if you have several risk factors. HOME CARE INSTRUCTIONS   Follow your diet and exercise programs as suggested by your caregiver.  Take medications as directed.  Have blood work done when your caregiver feels it is necessary. MAKE SURE YOU:   Understand these instructions.  Will watch your condition.  Will get help right away if you are not doing well or get worse. Document Released: 09/30/2005 Document Revised: 12/23/2011 Document Reviewed: 03/18/2007 [facility] Patient Information 2014 [place], [place].

## 2013-03-25 NOTE — Telephone Encounter (Signed)
Pt is coming in today. Will discuss then

## 2013-03-26 ENCOUNTER — Encounter: Payer: Self-pay | Admitting: Family Medicine

## 2013-03-27 NOTE — Assessment & Plan Note (Signed)
Well controlled on Lansoprazole, avoid offending foods.

## 2013-03-27 NOTE — Assessment & Plan Note (Signed)
No recent episodes

## 2013-03-27 NOTE — Assessment & Plan Note (Signed)
Well controlled today, encouraged DASH diet.

## 2013-03-27 NOTE — Progress Notes (Signed)
Patient ID: Toni Daniel, female   DOB: [birth year], 57 y.o.   MRN: [id number] Toni Daniel [id number] 01-11-1956 03/27/2013      Progress Note-Follow Up  Subjective  Chief Complaint  Chief Complaint  Patient presents with  . Follow-up    on medication    HPI  Patient is a 57 year old Caucasian female in today for followup. Stopped Crestor secondary to myalgias. Myalgias are improved when he stopped it. No recent illness. No chest pain, palpitations, headache, shortness of breath or GU complaints. Is still struggling with some reflux but it is minimal. Is using Prevacid routinely  Past Medical History  Diagnosis Date  . Migraine   . Chicken pox as a child  . Anemia 2004  . Hyperlipidemia 2003  . Perimenopause   . Heart palpitations   . Diarrhea 07/20/2012  . BCC (basal cell carcinoma), face   . Elevated BP 07/20/2012  . GERD (gastroesophageal reflux disease)     Past Surgical History  Procedure Laterality Date  . Cesarean section  1986 and 1990  . Tonsillectomy  16  . Wisdom tooth extraction  57 yrs old  . Skin cancer excision      2 BCC from face    Family History  Problem Relation Age of Onset  . Stroke Mother     post- operative- stroke  . Other Mother     mass in uterus- unsure if ca  . Hyperlipidemia Mother   . Heart disease Father   . COPD Father     smoked when he was young- hadn't smoked in 40 yrs  . Hyperlipidemia Sister   . Heart disease Sister   . Cancer Maternal Grandmother     gall bladder  . Pneumonia Paternal Grandfather   . Cancer Sister     breast or uterine?  Marland Kitchen Asthma Son     History   Social History  . Marital Status: Married    Spouse Name: N/A    Number of Children: N/A  . Years of Education: N/A   Occupational History  . Not on file.   Social History Main Topics  . Smoking status: Never Smoker   . Smokeless tobacco: Never Used  . Alcohol Use: Yes     Comment: 3-4 glasses of wine weekly  . Drug Use: No  .  Sexually Active: Yes -- Female partner(s)   Other Topics Concern  . Not on file   Social History Narrative  . No narrative on file    Current Outpatient Prescriptions on File Prior to Visit  Medication Sig Dispense Refill  . esomeprazole (NEXIUM) 40 MG capsule Take 1 capsule (40 mg total) by mouth daily before breakfast. Pt needs appt for addt refills  90 capsule  0  . ferrous sulfate 325 (65 FE) MG tablet Take 325 mg by mouth daily with breakfast.        . ferrous sulfate 325 (65 FE) MG tablet Take 1 tablet (325 mg total) by mouth 3 (three) times daily with meals.  30 tablet  0  . lansoprazole (PREVACID) 15 MG capsule Take 15 mg by mouth as needed.       . Multiple Vitamin (MULTIVITAMIN) tablet Take 1 tablet by mouth 2 (two) times a week.        . NON FORMULARY Sleep aide- prn      . ranitidine (ZANTAC) 300 MG tablet Take 1 tablet (300 mg total) by mouth at bedtime.  90 tablet  1  .  SUMAtriptan (IMITREX) 50 MG tablet Take 50 mg by mouth every 2 (two) hours as needed.      . tretinoin (RETIN-A) 0.025 % cream Apply topically at bedtime.  45 g  0   No current facility-administered medications on file prior to visit.    Allergies  Allergen Reactions  . Crestor (Rosuvastatin)     myalgias  . Lipitor (Atorvastatin)     myalgias  . Latex Rash    Review of Systems  Review of Systems  Constitutional: Negative for fever and malaise/fatigue.  HENT: Negative for congestion.   Eyes: Negative for discharge.  Respiratory: Negative for shortness of breath.   Cardiovascular: Negative for chest pain, palpitations and leg swelling.  Gastrointestinal: Positive for heartburn. Negative for nausea, abdominal pain and diarrhea.  Genitourinary: Negative for dysuria.  Musculoskeletal: Negative for falls.  Skin: Negative for rash.  Neurological: Negative for loss of consciousness and headaches.  Endo/Heme/Allergies: Negative for polydipsia.  Psychiatric/Behavioral: Negative for depression and  suicidal ideas. The patient is not nervous/anxious and does not have insomnia.     Objective  BP 122/80  Pulse 67  Temp(Src) 98.3 F (36.8 C) (Oral)  Ht 5\' 5"  (1.651 m)  Wt 153 lb 1.3 oz (69.437 kg)  BMI 25.47 kg/m2  SpO2 97%  LMP 01/23/2013  Physical Exam  Physical Exam  Constitutional: She is oriented to person, place, and time and well-developed, well-nourished, and in no distress. No distress.  HENT:  Head: Normocephalic and atraumatic.  Eyes: Conjunctivae are normal.  Neck: Neck supple. No thyromegaly present.  Cardiovascular: Normal rate, regular rhythm and normal heart sounds.   No murmur heard. Pulmonary/Chest: Effort normal and breath sounds normal. She has no wheezes.  Abdominal: She exhibits no distension and no mass.  Musculoskeletal: She exhibits no edema.  Lymphadenopathy:    She has no cervical adenopathy.  Neurological: She is alert and oriented to person, place, and time.  Skin: Skin is warm and dry. No rash noted. She is not diaphoretic.  Psychiatric: Memory, affect and judgment normal.    Lab Results  Component Value Date   TSH 1.050 03/25/2013   Lab Results  Component Value Date   WBC 4.8 03/25/2013   HGB 14.6 03/25/2013   HCT 42.6 03/25/2013   MCV 85.4 03/25/2013   PLT 203 03/25/2013   Lab Results  Component Value Date   CREATININE 0.81 03/25/2013   BUN 16 03/25/2013   NA 139 03/25/2013   K 4.4 03/25/2013   CL 101 03/25/2013   CO2 29 03/25/2013   Lab Results  Component Value Date   ALT 22 03/25/2013   AST 20 03/25/2013   ALKPHOS 76 03/25/2013   BILITOT 0.5 03/25/2013   Lab Results  Component Value Date   CHOL 259* 03/25/2013   Lab Results  Component Value Date   HDL 50 03/25/2013   Lab Results  Component Value Date   LDLCALC 181* 03/25/2013   Lab Results  Component Value Date   TRIG 140 03/25/2013   Lab Results  Component Value Date   CHOLHDL 5.2 03/25/2013     Assessment & Plan  Hyperlipidemia Avoid trans fats, increase  exercise. Given rx for Livalo 1 mg po qhs. And start CoQ10 daily  Elevated BP Well controlled today, encouraged DASH diet.  GERD (gastroesophageal reflux disease) Well controlled on Lansoprazole, avoid offending foods.   Heart palpitations No recent episodes.

## 2013-03-27 NOTE — Assessment & Plan Note (Signed)
Avoid trans fats, increase exercise. Given rx for Livalo 1 mg po qhs. And start CoQ10 daily

## 2013-04-02 ENCOUNTER — Other Ambulatory Visit: Payer: Self-pay

## 2013-04-02 DIAGNOSIS — K219 Gastro-esophageal reflux disease without esophagitis: Secondary | ICD-10-CM

## 2013-04-02 DIAGNOSIS — E785 Hyperlipidemia, unspecified: Secondary | ICD-10-CM

## 2013-04-02 MED ORDER — ROSUVASTATIN CALCIUM 40 MG PO TABS: 40.0000 mg | ORAL_TABLET | Freq: Every day | ORAL | Status: DC

## 2013-04-02 MED ORDER — PITAVASTATIN CALCIUM 1 MG PO TABS
1.0000 mg | ORAL_TABLET | Freq: Every day | ORAL | Status: DC
Start: 2013-04-02 — End: 2013-11-25

## 2013-07-27 ENCOUNTER — Encounter: Payer: BC Managed Care – PPO | Admitting: Family Medicine

## 2013-07-28 ENCOUNTER — Ambulatory Visit (HOSPITAL_BASED_OUTPATIENT_CLINIC_OR_DEPARTMENT_OTHER)
Admission: RE | Admit: 2013-07-28 | Discharge: 2013-07-28 | Disposition: A | Discharge status: Home / Self Care | Payer: 59 | Source: Ambulatory Visit | Attending: Physician Assistant | Admitting: Physician Assistant

## 2013-07-28 ENCOUNTER — Ambulatory Visit (INDEPENDENT_AMBULATORY_CARE_PROVIDER_SITE_OTHER): Payer: 59 | Admitting: Physician Assistant

## 2013-07-28 ENCOUNTER — Encounter: Payer: Self-pay | Admitting: Family Medicine

## 2013-07-28 ENCOUNTER — Encounter: Payer: Self-pay | Admitting: Physician Assistant

## 2013-07-28 ENCOUNTER — Ambulatory Visit (INDEPENDENT_AMBULATORY_CARE_PROVIDER_SITE_OTHER): Payer: 59 | Admitting: Family Medicine

## 2013-07-28 VITALS — BP 145/84 | HR 76 | Ht 65.0 in | Wt 150.0 lb

## 2013-07-28 VITALS — BP 120/84 | HR 70 | Temp 98.5°F | Resp 14 | Ht 65.0 in | Wt 157.0 lb

## 2013-07-28 DIAGNOSIS — S92919A Unspecified fracture of unspecified toe(s), initial encounter for closed fracture: Secondary | ICD-10-CM | POA: Insufficient documentation

## 2013-07-28 DIAGNOSIS — M79609 Pain in unspecified limb: Secondary | ICD-10-CM

## 2013-07-28 DIAGNOSIS — X58XXXA Exposure to other specified factors, initial encounter: Secondary | ICD-10-CM | POA: Insufficient documentation

## 2013-07-28 DIAGNOSIS — S92911A Unspecified fracture of right toe(s), initial encounter for closed fracture: Secondary | ICD-10-CM | POA: Insufficient documentation

## 2013-07-28 DIAGNOSIS — M79674 Pain in right toe(s): Secondary | ICD-10-CM

## 2013-07-28 NOTE — Assessment & Plan Note (Signed)
Right 4th digit proximal phalanx comminuted fracture.  No clinical displacement (husband reduced this after it happened).  Advised these typically take 6 weeks to improve.  Postop shoe, buddy taping.  Icing, elevation.  Tylenol, ibuprofen.  F/u in 4 weeks for reevaluation if still having problems.

## 2013-07-28 NOTE — Progress Notes (Signed)
Patient ID: Toni Daniel, female   DOB: 10-Dec-1955, 57 y.o.   MRN: [id number]  Patient presents to clinic today c/o pain and swelling in the fourth phalanx of her right foot, following an injury sustained 2-1/2 weeks ago.  Patient states she was trying to close the legs of a fold-out table and swung her foot to kick the table leg, missed and hit her toe against a hard surface.  Patient endorses sharp pain, bruising and swelling following incident.  Patient states her husband is a physical therapist and advised her to "buddy taped" her toe. Has also been putting ice to affected area daily.  Patient states that the swelling has been persistent. She is unable to move her toe. Denies decreased sensation, pallor, or coldness of affected toe.  Patient thought she initially may have stubbed her toe, and her husband pulled on her toe.  Patient has been wearing a special shoe but her husband gave her that is good for toe and foot injuries.  Patient endorses that she accidentally stubbed her toe again yesterday.  Pain has been constant. Patient denies fever, chills, redness affected site. Otherwise feeling okay. Has tried to stay off her right foot is much possible.   Past Medical History  Diagnosis Date  . Migraine   . Chicken pox as a child  . Anemia 2004  . Hyperlipidemia 2003  . Perimenopause   . Heart palpitations   . Diarrhea 07/20/2012  . BCC (basal cell carcinoma), face   . Elevated BP 07/20/2012  . GERD (gastroesophageal reflux disease)     Current Outpatient Prescriptions on File Prior to Visit  Medication Sig Dispense Refill  . esomeprazole (NEXIUM) 40 MG capsule Take 1 capsule (40 mg total) by mouth daily before breakfast. Pt needs appt for addt refills  90 capsule  0  . ferrous sulfate 325 (65 FE) MG tablet Take 325 mg by mouth daily with breakfast.        . lansoprazole (PREVACID) 15 MG capsule Take 15 mg by mouth as needed.       . Multiple Vitamin (MULTIVITAMIN) tablet Take 1 tablet  by mouth 2 (two) times a week.        . NON FORMULARY Sleep aide- prn      . pantoprazole (PROTONIX) 40 MG tablet Take 1 tablet (40 mg total) by mouth daily.  30 tablet  5  . Pitavastatin Calcium (LIVALO) 1 MG TABS Take 1 tablet (1 mg total) by mouth daily.  30 tablet  5  . ranitidine (ZANTAC) 300 MG tablet Take 1 tablet (300 mg total) by mouth at bedtime.  90 tablet  1  . SUMAtriptan (IMITREX) 50 MG tablet Take 50 mg by mouth every 2 (two) hours as needed.      . tretinoin (RETIN-A) 0.025 % cream Apply topically at bedtime.  45 g  0   No current facility-administered medications on file prior to visit.    Allergies  Allergen Reactions  . Crestor [Rosuvastatin]     myalgias  . Lipitor [Atorvastatin]     myalgias  . Latex Rash    Family History  Problem Relation Age of Onset  . Stroke Mother     post- operative- stroke  . Other Mother     mass in uterus- unsure if ca  . Hyperlipidemia Mother   . Heart disease Father   . COPD Father     smoked when he was young- hadn't smoked in 40 yrs  .  Hyperlipidemia Sister   . Heart disease Sister   . Cancer Maternal Grandmother     gall bladder  . Pneumonia Paternal Grandfather   . Cancer Sister     breast or uterine?  Marland Kitchen Asthma Son     History   Social History  . Marital Status: Married    Spouse Name: N/A    Number of Children: N/A  . Years of Education: N/A   Social History Main Topics  . Smoking status: Never Smoker   . Smokeless tobacco: Never Used  . Alcohol Use: Yes     Comment: 3-4 glasses of wine weekly  . Drug Use: No  . Sexual Activity: Yes    Partners: Male   Other Topics Concern  . None   Social History Narrative  . None   ROS See history of present illness. All other review of systems are negative.   Filed Vitals:   07/28/13 0822  BP: 120/84  Pulse: 70  Temp: 98.5 F (36.9 C)  Resp: 14   Physical Exam  Vitals reviewed. Constitutional: She is oriented to person, place, and time and  well-developed, well-nourished, and in no distress.  HENT:  Head: Normocephalic and atraumatic.  Neck: Neck supple.  Cardiovascular: Normal rate and regular rhythm.   Pulses:      Dorsalis pedis pulses are 2+ on the right side.       Posterior tibial pulses are 2+ on the right side.  Good capillary refill of bilateral lower extremities.  Musculoskeletal:       Right foot: She exhibits decreased range of motion, tenderness, bony tenderness and swelling. She exhibits normal capillary refill, no crepitus and no laceration.  Neurological: She is alert and oriented to person, place, and time.  Skin: Skin is warm and dry. No rash noted.    Assessment/Plan: Fracture of toe of right foot Stat x-ray obtained, revealing mildly displaced and comminuted fracture of fourth phalanx of right foot.  Patient referred to sports medicine for evaluation, given displacement of bone.  Appointment scheduled with Dr. Pearletha Daniel at 10:30 AM.

## 2013-07-28 NOTE — Progress Notes (Signed)
Patient ID: Toni Daniel, female   DOB: 22-May-1956, 57 y.o.   MRN: [id number]  PCP: Danise Edge, MD  Subjective:   HPI: Patient is a 57 y.o. female here for right 4th toe injury.  Patient reports about 2 1/2 weeks ago she was folding a table - kicked one of the legs but hit it with lateral toes. 4th toe was bent out to the side following this. Husband reduced this so now in line with the others. Started using postop shoe off and on. Stubbed toe a little yesterday also and some pain with this. + bruising and still swollen from initial injury. Has been icing.  Past Medical History  Diagnosis Date  . Migraine   . Chicken pox as a child  . Anemia 2004  . Hyperlipidemia 2003  . Perimenopause   . Heart palpitations   . Diarrhea 07/20/2012  . BCC (basal cell carcinoma), face   . Elevated BP 07/20/2012  . GERD (gastroesophageal reflux disease)     Current Outpatient Prescriptions on File Prior to Visit  Medication Sig Dispense Refill  . esomeprazole (NEXIUM) 40 MG capsule Take 1 capsule (40 mg total) by mouth daily before breakfast. Pt needs appt for addt refills  90 capsule  0  . ferrous sulfate 325 (65 FE) MG tablet Take 325 mg by mouth daily with breakfast.        . lansoprazole (PREVACID) 15 MG capsule Take 15 mg by mouth as needed.       . Multiple Vitamin (MULTIVITAMIN) tablet Take 1 tablet by mouth 2 (two) times a week.        . NON FORMULARY Sleep aide- prn      . pantoprazole (PROTONIX) 40 MG tablet Take 1 tablet (40 mg total) by mouth daily.  30 tablet  5  . Pitavastatin Calcium (LIVALO) 1 MG TABS Take 1 tablet (1 mg total) by mouth daily.  30 tablet  5  . ranitidine (ZANTAC) 300 MG tablet Take 1 tablet (300 mg total) by mouth at bedtime.  90 tablet  1  . SUMAtriptan (IMITREX) 50 MG tablet Take 50 mg by mouth every 2 (two) hours as needed.      . tretinoin (RETIN-A) 0.025 % cream Apply topically at bedtime.  45 g  0   No current facility-administered medications on  file prior to visit.    Past Surgical History  Procedure Laterality Date  . Cesarean section  1986 and 1990  . Tonsillectomy  16  . Wisdom tooth extraction  57 yrs old  . Skin cancer excision      2 BCC from face    Allergies  Allergen Reactions  . Crestor [Rosuvastatin]     myalgias  . Lipitor [Atorvastatin]     myalgias  . Latex Rash    History   Social History  . Marital Status: Married    Spouse Name: N/A    Number of Children: N/A  . Years of Education: N/A   Occupational History  . Not on file.   Social History Main Topics  . Smoking status: Never Smoker   . Smokeless tobacco: Never Used  . Alcohol Use: Yes     Comment: 3-4 glasses of wine weekly  . Drug Use: No  . Sexual Activity: Yes    Partners: Male   Other Topics Concern  . Not on file   Social History Narrative  . No narrative on file    Family History  Problem Relation  Age of Onset  . Stroke Mother     post- operative- stroke  . Other Mother     mass in uterus- unsure if ca  . Hyperlipidemia Mother   . Heart disease Father   . COPD Father     smoked when he was young- hadn't smoked in 40 yrs  . Hyperlipidemia Sister   . Heart disease Sister   . Cancer Maternal Grandmother     gall bladder  . Pneumonia Paternal Grandfather   . Cancer Sister     breast or uterine?  Marland Kitchen Asthma Son     BP 145/84  Pulse 76  Ht 5\' 5"  (1.651 m)  Wt 150 lb (68.04 kg)  BMI 24.96 kg/m2  LMP 01/26/2013  Review of Systems: See HPI above.    Objective:  Physical Exam:  Gen: NAD  Right foot: Mild swelling 4th digit, less 3rd and 5th digits.  No bruising, other deformity.  No angulation, malrotation. TTP greatest proximal phalanx area of 4th digit.  No other TTP. Difficulty flexing, extending digits.  FROM ankle. Cap refill < 2 sec.    Assessment & Plan:  1. Right 4th digit proximal phalanx comminuted fracture.  No clinical displacement (husband reduced this after it happened).  Advised these  typically take 6 weeks to improve.  Postop shoe, buddy taping.  Icing, elevation.  Tylenol, ibuprofen.  F/u in 4 weeks for reevaluation if still having problems.

## 2013-07-28 NOTE — Assessment & Plan Note (Signed)
Stat x-ray obtained, revealing mildly displaced and comminuted fracture of fourth phalanx of right foot.  Patient referred to sports medicine for evaluation, given displacement of bone.  Appointment scheduled with Dr. Pearletha Forge at 10:30 AM.

## 2013-07-28 NOTE — Patient Instructions (Signed)
Please obtain imaging.  I will call you with your results.  Continue with pain medication, ice and elevating right foot.

## 2013-07-28 NOTE — Patient Instructions (Signed)
You have a comminuted proximal phalanx fracture. This is treated conservatively (if there was clinical displacement, angulation we would reduce this but this has already been done). Wear postop shoe when up and walking around for 4 weeks. Buddy tape to adjacent toes. Icing 15 minutes at a time 3-4 times a day as needed for pain, swelling. Elevate as much as possible. Ibuprofen 600mg  three times a day OR aleve 2 tabs twice a day with food for pain and inflammation. Ok to take tylenol 500mg  1-2 tabs three times a day in addition to this. Follow up with me in 4 weeks if not improved.

## 2013-08-19 ENCOUNTER — Other Ambulatory Visit: Payer: Self-pay

## 2013-08-20 ENCOUNTER — Telehealth: Payer: Self-pay

## 2013-08-20 NOTE — Telephone Encounter (Signed)
Patient left a message stating that her insurance company won't pay for her Nexium 40 mg without her MD's office calling them for an appeal.  The phone number is [phone]

## 2013-09-03 NOTE — Telephone Encounter (Signed)
Called below number and reached a fax machine.

## 2013-09-21 ENCOUNTER — Encounter: Payer: BC Managed Care – PPO | Admitting: Family Medicine

## 2013-09-22 ENCOUNTER — Telehealth: Payer: Self-pay | Admitting: Family Medicine

## 2013-09-22 ENCOUNTER — Ambulatory Visit: Payer: 59 | Admitting: Family

## 2013-09-22 DIAGNOSIS — K649 Unspecified hemorrhoids: Secondary | ICD-10-CM

## 2013-09-22 NOTE — Telephone Encounter (Signed)
SHE HAD AN APPOINTMENT THIS MORNING FOR A GROWTH AT [facility]  SHE THINKS NOW IT IS A HEMORRIOD AND WOULD LIKE TO JUST GO AHEAD TO THE SPECIALIST.

## 2013-09-22 NOTE — Telephone Encounter (Signed)
Please advise 

## 2013-09-22 NOTE — Telephone Encounter (Signed)
Referral to Colorectal Surgery placed.  Patient will be contacted by their office for appointment date/time.

## 2013-10-05 ENCOUNTER — Telehealth: Payer: Self-pay | Admitting: Family Medicine

## 2013-10-05 DIAGNOSIS — K219 Gastro-esophageal reflux disease without esophagitis: Secondary | ICD-10-CM

## 2013-10-05 MED ORDER — PANTOPRAZOLE SODIUM 40 MG PO TBEC: 40.0000 mg | DELAYED_RELEASE_TABLET | Freq: Every day | ORAL | Status: DC

## 2013-10-05 NOTE — Telephone Encounter (Signed)
RX sent

## 2013-10-05 NOTE — Telephone Encounter (Signed)
refill-pantoprazole sod dr 40mg  tab. Take one tablet by mouth every day. Qty 30 last fill 11.29.14

## 2013-11-02 ENCOUNTER — Encounter: Payer: 59 | Admitting: Family Medicine

## 2013-11-03 ENCOUNTER — Encounter: Payer: Self-pay | Admitting: Family Medicine

## 2013-11-03 ENCOUNTER — Ambulatory Visit (INDEPENDENT_AMBULATORY_CARE_PROVIDER_SITE_OTHER): Payer: 59 | Admitting: Family Medicine

## 2013-11-03 VITALS — BP 142/84 | HR 67 | Temp 98.0°F | Ht 65.0 in | Wt 162.0 lb

## 2013-11-03 DIAGNOSIS — R002 Palpitations: Secondary | ICD-10-CM

## 2013-11-03 DIAGNOSIS — Z0000 Encounter for general adult medical examination without abnormal findings: Secondary | ICD-10-CM

## 2013-11-03 DIAGNOSIS — Z1211 Encounter for screening for malignant neoplasm of colon: Secondary | ICD-10-CM

## 2013-11-03 DIAGNOSIS — IMO0001 Reserved for inherently not codable concepts without codable children: Secondary | ICD-10-CM

## 2013-11-03 DIAGNOSIS — R197 Diarrhea, unspecified: Secondary | ICD-10-CM

## 2013-11-03 DIAGNOSIS — N951 Menopausal and female climacteric states: Secondary | ICD-10-CM

## 2013-11-03 DIAGNOSIS — E785 Hyperlipidemia, unspecified: Secondary | ICD-10-CM

## 2013-11-03 DIAGNOSIS — R030 Elevated blood-pressure reading, without diagnosis of hypertension: Secondary | ICD-10-CM

## 2013-11-03 LAB — CBC
HEMATOCRIT: 41.1 % (ref 36.0–46.0)
Hemoglobin: 14.3 g/dL (ref 12.0–15.0)
MCH: 29.6 pg (ref 26.0–34.0)
MCHC: 34.8 g/dL (ref 30.0–36.0)
MCV: 85.1 fL (ref 78.0–100.0)
Platelets: 178 10*3/uL (ref 150–400)
RBC: 4.83 MIL/uL (ref 3.87–5.11)
RDW: 14.5 % (ref 11.5–15.5)
WBC: 4.4 10*3/uL (ref 4.0–10.5)

## 2013-11-03 NOTE — Progress Notes (Signed)
Patient ID: Toni Daniel, female   DOB: [birth year], 58 y.o.   MRN: [id number] PERLINE AWE [id number] 05-24-1956 11/03/2013      Progress Note-Follow Up  Subjective  Chief Complaint  Chief Complaint  Patient presents with  . Annual Exam    physical    HPI  Patient is a 45 female who is in today for routine medical care. Generally feeling well. Proceed with a mammogram today. Technologist some increased fatigue and cold sensitivity. She's not had a period in almost a year and notes irritable mood as well as palpitations and weight gain since then. Also complains of increasing dry skin no illness. No headache or chest pain. No palpitations shortness of GI or GU concerns  Past Medical History  Diagnosis Date  . Migraine   . Chicken pox as a child  . Anemia 2004  . Hyperlipidemia 2003  . Perimenopause   . Heart palpitations   . Diarrhea 07/20/2012  . BCC (basal cell carcinoma), face   . Elevated BP 07/20/2012  . GERD (gastroesophageal reflux disease)     Past Surgical History  Procedure Laterality Date  . Cesarean section  1986 and 1990  . Tonsillectomy  16  . Wisdom tooth extraction  58 yrs old  . Skin cancer excision      2 BCC from face    Family History  Problem Relation Age of Onset  . Stroke Mother     post- operative- stroke  . Other Mother     mass in uterus- unsure if ca  . Hyperlipidemia Mother   . Heart disease Father   . COPD Father     smoked when he was young- hadn't smoked in 28 yrs  . Hyperlipidemia Sister   . Heart disease Sister   . Cancer Maternal Grandmother     gall bladder  . Pneumonia Paternal Grandfather   . Cancer Sister     breast or uterine?  Marland Kitchen Asthma Son     History   Social History  . Marital Status: Married    Spouse Name: N/A    Number of Children: N/A  . Years of Education: N/A   Occupational History  . Not on file.   Social History Main Topics  . Smoking status: Never Smoker   . Smokeless tobacco: Never  Used  . Alcohol Use: Yes     Comment: 3-4 glasses of wine weekly  . Drug Use: No  . Sexual Activity: Yes    Partners: Male   Other Topics Concern  . Not on file   Social History Narrative  . No narrative on file    Current Outpatient Prescriptions on File Prior to Visit  Medication Sig Dispense Refill  . esomeprazole (NEXIUM) 40 MG capsule Take 1 capsule (40 mg total) by mouth daily before breakfast. Pt needs appt for addt refills  90 capsule  0  . lansoprazole (PREVACID) 15 MG capsule Take 15 mg by mouth as needed.       . pantoprazole (PROTONIX) 40 MG tablet Take 1 tablet (40 mg total) by mouth daily.  30 tablet  5  . Pitavastatin Calcium (LIVALO) 1 MG TABS Take 1 tablet (1 mg total) by mouth daily.  30 tablet  5  . SUMAtriptan (IMITREX) 50 MG tablet Take 50 mg by mouth every 2 (two) hours as needed.      . tretinoin (RETIN-A) 0.025 % cream Apply topically at bedtime.  45 g  0   No  current facility-administered medications on file prior to visit.    Allergies  Allergen Reactions  . Crestor [Rosuvastatin]     myalgias  . Lipitor [Atorvastatin]     myalgias  . Latex Rash    Review of Systems  Review of Systems  Constitutional: Negative for fever, chills and malaise/fatigue.  HENT: Negative for congestion, hearing loss and nosebleeds.   Eyes: Negative for discharge.  Respiratory: Negative for cough, sputum production, shortness of breath and wheezing.   Cardiovascular: Negative for chest pain, palpitations and leg swelling.  Gastrointestinal: Negative for heartburn, nausea, vomiting, abdominal pain, diarrhea, constipation and blood in stool.  Genitourinary: Negative for dysuria, urgency, frequency and hematuria.  Musculoskeletal: Negative for back pain, falls and myalgias.  Skin: Negative for rash.  Neurological: Negative for dizziness, tremors, sensory change, focal weakness, loss of consciousness, weakness and headaches.  Endo/Heme/Allergies: Negative for polydipsia.  Does not bruise/bleed easily.  Psychiatric/Behavioral: Negative for depression and suicidal ideas. The patient is not nervous/anxious and does not have insomnia.     Objective  BP 142/84  Pulse 67  Temp(Src) 98 F (36.7 C) (Oral)  Ht 5\' 5"  (1.651 m)  Wt 162 lb (73.483 kg)  BMI 26.96 kg/m2  SpO2 96%  LMP 11/14/2012  Physical Exam Physical Exam  Constitutional: She is oriented to person, place, and time and well-developed, well-nourished, and in no distress. No distress.  HENT:  Head: Normocephalic and atraumatic.  Eyes: Conjunctivae are normal.  Neck: Neck supple. No thyromegaly present.  Cardiovascular: Normal rate, regular rhythm and normal heart sounds.   No murmur heard. Pulmonary/Chest: Effort normal and breath sounds normal. She has no wheezes.  Abdominal: She exhibits no distension and no mass.  Musculoskeletal: She exhibits no edema.  Lymphadenopathy:    She has no cervical adenopathy.  Neurological: She is alert and oriented to person, place, and time.  Skin: Skin is warm and dry. No rash noted. She is not diaphoretic.  Psychiatric: Memory, affect and judgment normal.    Lab Results  Component Value Date   TSH 1.050 03/25/2013   Lab Results  Component Value Date   WBC 4.8 03/25/2013   HGB 14.6 03/25/2013   HCT 42.6 03/25/2013   MCV 85.4 03/25/2013   PLT 203 03/25/2013   Lab Results  Component Value Date   CREATININE 0.81 03/25/2013   BUN 16 03/25/2013   NA 139 03/25/2013   K 4.4 03/25/2013   CL 101 03/25/2013   CO2 29 03/25/2013   Lab Results  Component Value Date   ALT 22 03/25/2013   AST 20 03/25/2013   ALKPHOS 76 03/25/2013   BILITOT 0.5 03/25/2013   Lab Results  Component Value Date   CHOL 259* 03/25/2013   Lab Results  Component Value Date   HDL 50 03/25/2013   Lab Results  Component Value Date   LDLCALC 181* 03/25/2013   Lab Results  Component Value Date   TRIG 140 03/25/2013   Lab Results  Component Value Date   CHOLHDL 5.2 03/25/2013      Assessment & Plan  Elevated BP Mild, minimize sodium and caffeine  Heart palpitations No recent flare  Hyperlipidemia Tolerating Livalo, avoid trans fats, exercise as tolerated  Diarrhea Avoid offending foods, referred tp gastroenterology, encouraged probiotics and fiber supplement. May use Imodium prn  Perimenopause LMP February 2014. Patient notes increased irritability, weight and palpitations since she stopped her period. Can follow up with GYN if she wants to consider hormones. Encouraged increased exercise  and fatty acid supplements.  Preventative health care Patient declines Prevnar and flu shot. Encouraged heart healthy diet and regular exercise. Encouraged 8 hours of sleep. Checked fasting labs and ekg

## 2013-11-03 NOTE — Patient Instructions (Addendum)
Consider a probiotic daily Digestive Advantage, Align, Culturelle  Fiber, Benefiber twice    No processed foods DASH Diet The DASH diet stands for "Dietary Approaches to Stop Hypertension." It is a healthy eating plan that has been shown to reduce high blood pressure (hypertension) in as little as 14 days, while also possibly providing other significant health benefits. These other health benefits include reducing the risk of breast cancer after menopause and reducing the risk of type 2 diabetes, heart disease, colon cancer, and stroke. Health benefits also include weight loss and slowing kidney failure in patients with chronic kidney disease.  DIET GUIDELINES  Limit salt (sodium). Your diet should contain less than 1500 mg of sodium daily.  Limit refined or processed carbohydrates. Your diet should include mostly whole grains. Desserts and added sugars should be used sparingly.  Include small amounts of heart-healthy fats. These types of fats include nuts, oils, and tub margarine. Limit saturated and trans fats. These fats have been shown to be harmful in the body. CHOOSING FOODS  The following food groups are based on a 2000 calorie diet. See your Registered Dietitian for individual calorie needs. Grains and Grain Products (6 to 8 servings daily)  Eat More Often: Whole-wheat bread, brown rice, whole-grain or wheat pasta, quinoa, popcorn without added fat or salt (air popped).  Eat Less Often: White bread, white pasta, white rice, cornbread. Vegetables (4 to 5 servings daily)  Eat More Often: Fresh, frozen, and canned vegetables. Vegetables may be raw, steamed, roasted, or grilled with a minimal amount of fat.  Eat Less Often/Avoid: Creamed or fried vegetables. Vegetables in a cheese sauce. Fruit (4 to 5 servings daily)  Eat More Often: All fresh, canned (in natural juice), or frozen fruits. Dried fruits without added sugar. One hundred percent fruit juice ( cup [237 mL] daily).  Eat  Less Often: Dried fruits with added sugar. Canned fruit in light or heavy syrup. [employer], Fish, and Poultry (2 servings or less daily. One serving is 3 to 4 oz [85-114 g]).  Eat More Often: Ninety percent or leaner ground beef, tenderloin, sirloin. Round cuts of beef, chicken breast, [place] breast. All fish. Grill, bake, or broil your meat. Nothing should be fried.  Eat Less Often/Avoid: Fatty cuts of meat, [place], or chicken leg, thigh, or wing. Fried cuts of meat or fish. Dairy (2 to 3 servings)  Eat More Often: Low-fat or fat-free milk, low-fat plain or light yogurt, reduced-fat or part-skim cheese.  Eat Less Often/Avoid: Milk (whole, 2%).Whole milk yogurt. Full-fat cheeses. Nuts, Seeds, and Legumes (4 to 5 servings per week)  Eat More Often: All without added salt.  Eat Less Often/Avoid: Salted nuts and seeds, canned beans with added salt. Fats and Sweets (limited)  Eat More Often: Vegetable oils, tub margarines without trans fats, sugar-free gelatin. Mayonnaise and salad dressings.  Eat Less Often/Avoid: Coconut oils, palm oils, butter, stick margarine, cream, half and half, cookies, candy, pie. FOR MORE INFORMATION The Dash Diet Eating Plan: www.dashdiet.org Document Released: 09/19/2011 Document Revised: 12/23/2011 Document Reviewed: 09/19/2011 [facility] Patient Information 2014 [place], [place].

## 2013-11-03 NOTE — Progress Notes (Signed)
Pre visit review using our clinic review tool, if applicable. No additional management support is needed unless otherwise documented below in the visit note. 

## 2013-11-04 LAB — RENAL FUNCTION PANEL
ALBUMIN: 4.2 g/dL (ref 3.5–5.2)
BUN: 12 mg/dL (ref 6–23)
CO2: 29 mEq/L (ref 19–32)
CREATININE: 0.63 mg/dL (ref 0.50–1.10)
Calcium: 9 mg/dL (ref 8.4–10.5)
Chloride: 102 mEq/L (ref 96–112)
Glucose, Bld: 93 mg/dL (ref 70–99)
Phosphorus: 3.6 mg/dL (ref 2.3–4.6)
Potassium: 4.3 mEq/L (ref 3.5–5.3)
Sodium: 137 mEq/L (ref 135–145)

## 2013-11-04 LAB — LIPID PANEL
Cholesterol: 245 mg/dL — ABNORMAL HIGH (ref 0–200)
HDL: 49 mg/dL (ref >39)
LDL Cholesterol: 170 mg/dL — ABNORMAL HIGH (ref 0–99)
Total CHOL/HDL Ratio: 5 Ratio
Triglycerides: 130 mg/dL (ref <150)
VLDL: 26 mg/dL (ref 0–40)

## 2013-11-04 LAB — HEPATIC FUNCTION PANEL
ALBUMIN: 4.2 g/dL (ref 3.5–5.2)
ALT: 28 U/L (ref 0–35)
AST: 23 U/L (ref 0–37)
Alkaline Phosphatase: 78 U/L (ref 39–117)
BILIRUBIN TOTAL: 0.4 mg/dL (ref 0.3–1.2)
Bilirubin, Direct: 0.1 mg/dL (ref 0.0–0.3)
Indirect Bilirubin: 0.3 mg/dL (ref 0.0–0.9)
Total Protein: 6.9 g/dL (ref 6.0–8.3)

## 2013-11-04 LAB — TSH: TSH: 1.337 u[IU]/mL (ref 0.350–4.500)

## 2013-11-08 ENCOUNTER — Encounter: Payer: Self-pay | Admitting: Family Medicine

## 2013-11-08 DIAGNOSIS — Z0000 Encounter for general adult medical examination without abnormal findings: Secondary | ICD-10-CM | POA: Insufficient documentation

## 2013-11-08 HISTORY — DX: Encounter for general adult medical examination without abnormal findings: Z00.00

## 2013-11-08 NOTE — Assessment & Plan Note (Signed)
Patient declines Prevnar and flu shot. Encouraged heart healthy diet and regular exercise. Encouraged 8 hours of sleep. Checked fasting labs and ekg

## 2013-11-08 NOTE — Assessment & Plan Note (Signed)
Avoid offending foods, referred tp gastroenterology, encouraged probiotics and fiber supplement. May use Imodium prn

## 2013-11-08 NOTE — Assessment & Plan Note (Signed)
No recent flare.  

## 2013-11-08 NOTE — Assessment & Plan Note (Signed)
Tolerating Livalo, avoid trans fats, exercise as tolerated

## 2013-11-08 NOTE — Assessment & Plan Note (Addendum)
LMP February 2014. Patient notes increased irritability, weight and palpitations since she stopped her period. Can follow up with GYN if she wants to consider hormones. Encouraged increased exercise and fatty acid supplements.

## 2013-11-08 NOTE — Assessment & Plan Note (Signed)
Mild, minimize sodium and caffeine

## 2013-11-10 ENCOUNTER — Ambulatory Visit (HOSPITAL_BASED_OUTPATIENT_CLINIC_OR_DEPARTMENT_OTHER): Payer: 59

## 2013-11-24 ENCOUNTER — Encounter: Payer: Self-pay | Admitting: Family Medicine

## 2013-11-24 DIAGNOSIS — E785 Hyperlipidemia, unspecified: Secondary | ICD-10-CM

## 2013-11-25 MED ORDER — PITAVASTATIN CALCIUM 2 MG PO TABS: 2.0000 mg | ORAL_TABLET | Freq: Every day | ORAL | Status: DC

## 2013-11-25 NOTE — Telephone Encounter (Signed)
RX sent and labs ordered. 

## 2013-12-12 ENCOUNTER — Other Ambulatory Visit: Payer: Self-pay | Admitting: Family Medicine

## 2014-01-25 ENCOUNTER — Other Ambulatory Visit: Payer: Self-pay | Admitting: Family Medicine

## 2014-02-24 ENCOUNTER — Other Ambulatory Visit: Payer: Self-pay | Admitting: Family Medicine

## 2014-02-24 NOTE — Telephone Encounter (Signed)
Please advise refill? It looks like pt is taking Livalo 1 mg?

## 2014-02-24 NOTE — Telephone Encounter (Signed)
Clarify with patient what strength she is taking and then we can refill

## 2014-02-25 NOTE — Telephone Encounter (Signed)
Patient left a message stating that she is taking the 2 mg. Pt states MD changed this a few months ago? MAR updated and medication sent to pharmacy

## 2014-02-25 NOTE — Telephone Encounter (Signed)
Left a detailed message for patient to return our call 

## 2014-03-01 ENCOUNTER — Other Ambulatory Visit: Payer: Self-pay | Admitting: Family Medicine

## 2014-03-01 NOTE — Telephone Encounter (Signed)
Please advise refill? It doesn't look like we have ever filled this?

## 2014-03-31 ENCOUNTER — Other Ambulatory Visit: Payer: Self-pay | Admitting: Family Medicine

## 2014-04-11 ENCOUNTER — Other Ambulatory Visit: Payer: Self-pay | Admitting: Family Medicine

## 2014-04-11 ENCOUNTER — Telehealth: Payer: Self-pay | Admitting: Family Medicine

## 2014-04-11 DIAGNOSIS — G43909 Migraine, unspecified, not intractable, without status migrainosus: Secondary | ICD-10-CM

## 2014-04-11 MED ORDER — SUMATRIPTAN 20 MG/ACT NA SOLN: 20.0000 mg | NASAL | Status: AC | PRN

## 2014-04-11 NOTE — Telephone Encounter (Signed)
Please advise spray sumatriptan?

## 2014-04-11 NOTE — Telephone Encounter (Signed)
Sent to pharmacy 

## 2014-04-11 NOTE — Telephone Encounter (Signed)
Refill- sumatriptan  Message from pharmacy: patient request spray for sumatriptan  [facility] [id number]

## 2014-05-06 ENCOUNTER — Other Ambulatory Visit: Payer: Self-pay | Admitting: Family Medicine

## 2014-07-12 ENCOUNTER — Telehealth: Payer: Self-pay | Admitting: Family Medicine

## 2014-07-12 NOTE — Telephone Encounter (Signed)
Caller name: Kortne  Relation to pt: self  Call back number: [phone] Pharmacy:  Reason for call: requesting a referral for colonoscopy

## 2014-07-13 ENCOUNTER — Other Ambulatory Visit: Payer: Self-pay | Admitting: Family Medicine

## 2014-07-13 DIAGNOSIS — Z1211 Encounter for screening for malignant neoplasm of colon: Secondary | ICD-10-CM

## 2014-07-14 ENCOUNTER — Ambulatory Visit: Payer: 59

## 2014-07-18 ENCOUNTER — Other Ambulatory Visit: Payer: Self-pay | Admitting: Family Medicine

## 2014-07-29 ENCOUNTER — Other Ambulatory Visit: Payer: Self-pay

## 2014-09-11 ENCOUNTER — Other Ambulatory Visit: Payer: Self-pay | Admitting: Family Medicine

## 2014-11-01 ENCOUNTER — Telehealth: Payer: Self-pay | Admitting: *Deleted

## 2014-11-01 NOTE — Telephone Encounter (Signed)
PA for livalo initiated. Awaiting determination. JG//CMA

## 2014-11-01 NOTE — Telephone Encounter (Signed)
Approved effective 10/02/2014 through 10/31/2014. JG//CMA

## 2014-11-07 ENCOUNTER — Encounter: Payer: Self-pay | Admitting: Family

## 2014-11-07 ENCOUNTER — Ambulatory Visit: Payer: Self-pay | Admitting: Family Medicine

## 2014-11-07 ENCOUNTER — Ambulatory Visit (INDEPENDENT_AMBULATORY_CARE_PROVIDER_SITE_OTHER): Payer: Commercial Managed Care - PPO | Admitting: Family

## 2014-11-07 VITALS — BP 140/82 | HR 67 | Temp 98.1°F | Resp 18 | Ht 64.75 in | Wt 158.6 lb

## 2014-11-07 DIAGNOSIS — Z23 Encounter for immunization: Secondary | ICD-10-CM

## 2014-11-07 DIAGNOSIS — R109 Unspecified abdominal pain: Secondary | ICD-10-CM

## 2014-11-07 DIAGNOSIS — M546 Pain in thoracic spine: Secondary | ICD-10-CM

## 2014-11-07 DIAGNOSIS — R10A2 Flank pain, left side: Secondary | ICD-10-CM | POA: Insufficient documentation

## 2014-11-07 MED ORDER — MELOXICAM 7.5 MG PO TABS: 7.5000 mg | ORAL_TABLET | Freq: Every day | ORAL | Status: DC

## 2014-11-07 NOTE — Assessment & Plan Note (Addendum)
Patient has left flank pain, epigastric pain, and abdominal bloating. Sister passed away from peritoneal cancer and pt desires imaging to assess pain. Will assess with abdominal CT. Instructed to call if symptoms worsen or if not improved in 7-10 days.

## 2014-11-07 NOTE — Patient Instructions (Signed)
You will be contacted about your CT scan. Start meloxicam for pain.  Call if symptoms worsen or if not improved in 7-10 days.

## 2014-11-07 NOTE — Progress Notes (Signed)
Subjective:    Patient ID: Toni Daniel, female    DOB: 1956-08-08, 59 y.o.   MRN: [id number]  HPI Pt reports intermittent sharp pain in thoracic area of back (8/10) x 2 weeks exacerbated when she changes positions. Also worse  at end of the day after activity. No radiation, numbness, tingling.  Has ache in left side under ribs mostly with breathing since last Thursday. Pain is worse when she is jolted (7/10).  Reports she feels like there is something under her ribs She also reports bloating for a few weeks. Review of Systems  Gastrointestinal: Positive for diarrhea (Has had diarrhea for a few years. Had colonoscopy for this last week and a polyp was removed.). Negative for nausea, abdominal pain and blood in stool.       Had EGD/colonoscopy last Wednesday which confirmed a hiatal hernia. She has a fu with Dr. Man next week.   Genitourinary: Negative for difficulty urinating and dyspareunia.   Still has uterus/ovaries and gallbladder.    Past Medical History  Diagnosis Date  . Migraine   . Chicken pox as a child  . Anemia 2004  . Hyperlipidemia 2003  . Perimenopause   . Heart palpitations   . Diarrhea 07/20/2012  . BCC (basal cell carcinoma), face   . Elevated BP 07/20/2012  . GERD (gastroesophageal reflux disease)   . Preventative health care 11/08/2013    History   Social History  . Marital Status: Married    Spouse Name: N/A    Number of Children: N/A  . Years of Education: N/A   Occupational History  . Not on file.   Social History Main Topics  . Smoking status: Never Smoker   . Smokeless tobacco: Never Used  . Alcohol Use: Yes     Comment: 3-4 glasses of wine weekly  . Drug Use: No  . Sexual Activity:    Partners: Male   Other Topics Concern  . Not on file   Social History Narrative    Past Surgical History  Procedure Laterality Date  . Cesarean section  1986 and 1990  . Tonsillectomy  16  . Wisdom tooth extraction  59 yrs old  . Skin cancer  excision      2 BCC from face    Family History  Problem Relation Age of Onset  . Stroke Mother     post- operative- stroke  . Other Mother     mass in uterus- unsure if ca  . Hyperlipidemia Mother   . Heart disease Father   . COPD Father     smoked when he was young- hadn't smoked in 45 yrs  . Hyperlipidemia Sister   . Heart disease Sister   . Cancer Maternal Grandmother     gall bladder  . Pneumonia Paternal Grandfather   . Cancer Sister     breast or uterine?  Marland Kitchen Asthma Son     Allergies  Allergen Reactions  . Crestor [Rosuvastatin]     myalgias  . Lipitor [Atorvastatin]     myalgias  . Latex Rash    Current Outpatient Prescriptions on File Prior to Visit  Medication Sig Dispense Refill  . esomeprazole (NEXIUM) 40 MG capsule Take 1 capsule (40 mg total) by mouth daily before breakfast. Pt needs appt for addt refills 90 capsule 0  . lansoprazole (PREVACID) 15 MG capsule Take 15 mg by mouth as needed.     Marland Kitchen LIVALO 2 MG TABS TAKE 1 TABLET BY MOUTH  DAILY 30 tablet 1  . pantoprazole (PROTONIX) 40 MG tablet Take 1 tablet (40 mg total) by mouth daily. 30 tablet 5  . SUMAtriptan (IMITREX) 20 MG/ACT nasal spray Place 1 spray (20 mg total) into the nose every 2 (two) hours as needed for migraine or headache. Max of 2 sprays in 24 hours 1 Inhaler 2  . tretinoin (RETIN-A) 0.025 % cream Apply topically at bedtime. 45 g 0   No current facility-administered medications on file prior to visit.    BP 140/82 mmHg  Pulse 67  Temp(Src) 98.1 F (36.7 C) (Oral)  Resp 18  Ht 5' 4.75" (1.645 m)  Wt 158 lb 9.6 oz (71.94 kg)  BMI 26.59 kg/m2  SpO2 99%  LMP 05/14/2014   Objective:   Physical Exam  Constitutional: She is oriented to person, place, and time. She appears well-developed and well-nourished. No distress.  Cardiovascular: Normal rate, regular rhythm and normal heart sounds.   Pulmonary/Chest: Effort normal and breath sounds normal.  Abdominal: Soft. Bowel sounds are  normal. She exhibits no distension and no mass. There is tenderness (epigastric). There is no rebound and no guarding.  Musculoskeletal: Normal range of motion. She exhibits tenderness (left flank.). She exhibits no edema.  Negative straight leg raise bilaterally.  Neurological: She is alert and oriented to person, place, and time. She exhibits normal muscle tone.  Skin: Skin is warm and dry. She is not diaphoretic.  Psychiatric: She has a normal mood and affect. Her behavior is normal. Judgment and thought content normal.          Assessment & Plan:  Patient seen along with [facility] NP-student.  I have personally seen and examined patient and agree with Ms. Whitmire's assessment and plan- Debbrah Alar NP  Pt is very concerned that her pain is due to underlying malignancy.  I think that this is unlikely but certainly possible. Her sister died from malignancy and her initial presenting symptom was flank pain.  Will obtain CT abd/pelvis to further evaluate.  I did advise her to speak with Dr. Collene Mares re: abdominal bloating.  Add meloxicam for pain.

## 2014-11-07 NOTE — Assessment & Plan Note (Addendum)
Pain is likely musculoskeletal. Will initiate meloxicam and assess GB with CT scan.

## 2014-11-07 NOTE — Progress Notes (Signed)
Pre visit review using our clinic review tool, if applicable. No additional management support is needed unless otherwise documented below in the visit note. 

## 2014-11-16 ENCOUNTER — Telehealth: Payer: Self-pay | Admitting: *Deleted

## 2014-11-16 DIAGNOSIS — R109 Unspecified abdominal pain: Secondary | ICD-10-CM

## 2014-11-16 NOTE — Telephone Encounter (Signed)
Left detailed message for pt to return my call to set up lab appt for bmet so we can schedule CT.     From: Synthia Innocent      Sent: 11/07/2014  1:33 PM      To: Ronny Flurry, CMA        Hey!    Pt will need labs prior to CT scan, I have not scheduled CT yet. Did not know when he would be able to come. Just let me know. Thanks

## 2014-11-18 ENCOUNTER — Other Ambulatory Visit: Payer: 59

## 2014-11-21 MED ORDER — MELOXICAM 7.5 MG PO TABS
7.5000 mg | ORAL_TABLET | Freq: Every day | ORAL | Status: DC
Start: 2014-11-21 — End: 2015-04-10

## 2014-11-21 NOTE — Telephone Encounter (Signed)
i agree she needs imaging if she is still in pain. Is the pain influenced by positions, rolling over standing up etc? Would recommend the CT scan first it is the image of choice for ruling out kidney stone. Check with radiology and see how little we can give her to get a decent image of the kidneys. Hard to know which specialist to send her to just yet. Consider salon pas patches to affected area. It could still be musculosteletal

## 2014-11-21 NOTE — Telephone Encounter (Signed)
Refill sent.  Dr Charlett Blake-- Please advise.

## 2014-11-21 NOTE — Telephone Encounter (Signed)
Patient called today 11/21/14 to inform while on the mobic that was prescribed on 11/07/14 the side/back pain had completely gone.  The pain has returned and she did want to know if the CT was necessary.  Also, she did state she has a hernia and is concerned with having to drink something in preparation for the CT.

## 2014-11-21 NOTE — Telephone Encounter (Signed)
OK to refill meloxicam, please forward to Dr. Charlett Blake for review.

## 2014-11-21 NOTE — Telephone Encounter (Signed)
Spoke with pt. She states pain is still occurring so I advised her as below that she should proceed with CT. Pt would need to drink 30 oz of oral contrast and she states she is unable to do that due to her hiatal hernia (had trouble completing barium swallow in the past). Pt requests refill of meloxicam as it seemed to help originally?  Pt also wants to know what she should do from here, go to specialist, call insurance re: MRI? Pt would also like Dr Frederik Pear thoughts as well.  Please advise?

## 2014-11-21 NOTE — Telephone Encounter (Signed)
If pain has resolved, I think it is OK to hold off on CT. Let me know if recurrent pain occurs.

## 2014-11-22 ENCOUNTER — Other Ambulatory Visit (INDEPENDENT_AMBULATORY_CARE_PROVIDER_SITE_OTHER): Payer: Commercial Managed Care - PPO

## 2014-11-22 DIAGNOSIS — R109 Unspecified abdominal pain: Secondary | ICD-10-CM

## 2014-11-22 LAB — BASIC METABOLIC PANEL
BUN: 22 mg/dL (ref 6–23)
CHLORIDE: 104 meq/L (ref 96–112)
CO2: 28 meq/L (ref 19–32)
Calcium: 9 mg/dL (ref 8.4–10.5)
Creatinine, Ser: 0.71 mg/dL (ref 0.40–1.20)
GFR: 88.92 mL/min (ref >60.00)
Glucose, Bld: 101 mg/dL — ABNORMAL HIGH (ref 70–99)
POTASSIUM: 3.7 meq/L (ref 3.5–5.1)
SODIUM: 139 meq/L (ref 135–145)

## 2014-11-22 NOTE — Telephone Encounter (Signed)
perfect

## 2014-11-22 NOTE — Telephone Encounter (Signed)
Spoke with radiology, least amount of oral media sufficient to complete scan is 1 bottle (about 15oz). Notified pt of below recommendations. Pt states she is willing to try CT scan with just 1 bottle of contrast. Pt will return to the lab this afternoon at 1:45pm for labs. Pt states she seems to notice the pain more if she is moving or breathing.

## 2014-11-23 ENCOUNTER — Encounter: Payer: Self-pay | Admitting: Family

## 2014-11-23 DIAGNOSIS — R739 Hyperglycemia, unspecified: Secondary | ICD-10-CM | POA: Insufficient documentation

## 2014-11-24 ENCOUNTER — Encounter: Payer: 59 | Admitting: Family Medicine

## 2014-11-24 ENCOUNTER — Encounter (HOSPITAL_BASED_OUTPATIENT_CLINIC_OR_DEPARTMENT_OTHER): Payer: Self-pay

## 2014-11-24 ENCOUNTER — Ambulatory Visit (HOSPITAL_BASED_OUTPATIENT_CLINIC_OR_DEPARTMENT_OTHER): Payer: Commercial Managed Care - PPO

## 2014-11-24 ENCOUNTER — Ambulatory Visit (HOSPITAL_BASED_OUTPATIENT_CLINIC_OR_DEPARTMENT_OTHER)
Admission: RE | Admit: 2014-11-24 | Discharge: 2014-11-24 | Disposition: A | Discharge status: Home / Self Care | Payer: Commercial Managed Care - PPO | Source: Ambulatory Visit | Attending: Family | Admitting: Family

## 2014-11-24 DIAGNOSIS — R140 Abdominal distension (gaseous): Secondary | ICD-10-CM | POA: Insufficient documentation

## 2014-11-24 DIAGNOSIS — R197 Diarrhea, unspecified: Secondary | ICD-10-CM | POA: Insufficient documentation

## 2014-11-24 DIAGNOSIS — R1013 Epigastric pain: Secondary | ICD-10-CM | POA: Diagnosis present

## 2014-11-24 DIAGNOSIS — R109 Unspecified abdominal pain: Secondary | ICD-10-CM

## 2014-11-24 MED ORDER — IOHEXOL 300 MG/ML  SOLN
100.0000 mL | Freq: Once | INTRAMUSCULAR | Status: AC | PRN
  Administered 2014-11-24: 100 mL via INTRAVENOUS

## 2014-11-25 ENCOUNTER — Other Ambulatory Visit (HOSPITAL_BASED_OUTPATIENT_CLINIC_OR_DEPARTMENT_OTHER): Payer: Commercial Managed Care - PPO

## 2014-11-28 ENCOUNTER — Telehealth: Payer: Self-pay | Admitting: *Deleted

## 2014-11-28 NOTE — Telephone Encounter (Signed)
Spoke with Toni Daniel. She states she had a colonoscopy with Dr Collene Mares about 3 weeks ago and she does not have irritable bowel. Also states she does not have a problem with constipation and she has discussed these concerns with GI and has no further answers. Toni Daniel states she will call to r/s echo and will let us know if new order needs to be placed.  Please advise if any further thoughts/ recommendations re: Toni Daniel's pain?

## 2014-11-28 NOTE — Telephone Encounter (Signed)
-----   Message from Debbrah Alar, NP sent at 11/27/2014  7:43 PM EST ----- Pain and bloating could be due to irritable bowel.  If this persists, then I would recommend that she let us know and we can refer her to GI.  In regards to cardiomegaly, it is very mild.  I see that Dr. Charlett Blake placed an order for 2d echo 1/15 which she did not completed. Please place order for 2d echo dx palpitations.

## 2014-11-28 NOTE — Telephone Encounter (Signed)
No further thoughts. The colonoscopy and CT scan reassure [place] she is not suffering from anything structurally significant. She may need to come in for further consideration if pain persists.

## 2014-11-29 ENCOUNTER — Other Ambulatory Visit: Payer: Self-pay | Admitting: Family Medicine

## 2014-11-29 NOTE — Telephone Encounter (Signed)
Left detailed message on pt's cell# and to call if any further questions. 

## 2015-02-13 ENCOUNTER — Other Ambulatory Visit: Payer: Self-pay | Admitting: Family Medicine

## 2015-02-24 ENCOUNTER — Telehealth: Payer: Self-pay | Admitting: Family Medicine

## 2015-02-24 NOTE — Telephone Encounter (Signed)
Spoke with patient who wanted a full panel for TSH, lipids, etc drawn. Advised that she needed to check with her insurance company to see if they would pay. Advised that she has an appt 04/28/2015. States she will just wait. [facility]

## 2015-02-24 NOTE — Telephone Encounter (Signed)
Caller name: Toni Daniel Relation to pt: self Call back number: [phone] Pharmacy:  Reason for call: Pt is requesting to have TDAP shot since having a new grandchild, and pt is wanting to have lab test lipid panel as well to see if all levels are good as well. Please advise.

## 2015-02-28 ENCOUNTER — Ambulatory Visit: Payer: Commercial Managed Care - PPO

## 2015-04-07 ENCOUNTER — Telehealth: Payer: Self-pay | Admitting: Family Medicine

## 2015-04-07 NOTE — Telephone Encounter (Signed)
Pre visit letter mailed 04/07/15

## 2015-04-10 ENCOUNTER — Other Ambulatory Visit: Payer: Self-pay | Admitting: Family

## 2015-04-27 ENCOUNTER — Telehealth: Payer: Self-pay | Admitting: Behavioral Health

## 2015-04-27 NOTE — Telephone Encounter (Signed)
Unable to reach patient at time of Pre-Visit Call.  Left message for patient to return call when available.    

## 2015-04-28 ENCOUNTER — Ambulatory Visit (INDEPENDENT_AMBULATORY_CARE_PROVIDER_SITE_OTHER): Payer: Commercial Managed Care - PPO | Admitting: Family Medicine

## 2015-04-28 ENCOUNTER — Encounter: Payer: Self-pay | Admitting: Family Medicine

## 2015-04-28 VITALS — BP 138/90 | HR 69 | Temp 98.0°F | Ht 65.5 in | Wt 156.1 lb

## 2015-04-28 DIAGNOSIS — R030 Elevated blood-pressure reading, without diagnosis of hypertension: Secondary | ICD-10-CM | POA: Diagnosis not present

## 2015-04-28 DIAGNOSIS — R739 Hyperglycemia, unspecified: Secondary | ICD-10-CM

## 2015-04-28 DIAGNOSIS — E785 Hyperlipidemia, unspecified: Secondary | ICD-10-CM

## 2015-04-28 DIAGNOSIS — K219 Gastro-esophageal reflux disease without esophagitis: Secondary | ICD-10-CM

## 2015-04-28 DIAGNOSIS — IMO0001 Reserved for inherently not codable concepts without codable children: Secondary | ICD-10-CM

## 2015-04-28 DIAGNOSIS — Z0000 Encounter for general adult medical examination without abnormal findings: Secondary | ICD-10-CM | POA: Diagnosis not present

## 2015-04-28 DIAGNOSIS — R109 Unspecified abdominal pain: Secondary | ICD-10-CM

## 2015-04-28 LAB — LIPID PANEL
CHOL/HDL RATIO: 5
CHOLESTEROL: 255 mg/dL — AB (ref 0–200)
HDL: 51.5 mg/dL (ref >39.00)
LDL Cholesterol: 183 mg/dL — ABNORMAL HIGH (ref 0–99)
NonHDL: 203.5
Triglycerides: 102 mg/dL (ref 0.0–149.0)
VLDL: 20.4 mg/dL (ref 0.0–40.0)

## 2015-04-28 LAB — COMPREHENSIVE METABOLIC PANEL
ALBUMIN: 4.3 g/dL (ref 3.5–5.2)
ALK PHOS: 76 U/L (ref 39–117)
ALT: 20 U/L (ref 0–35)
AST: 21 U/L (ref 0–37)
BILIRUBIN TOTAL: 0.4 mg/dL (ref 0.2–1.2)
BUN: 19 mg/dL (ref 6–23)
CO2: 30 mEq/L (ref 19–32)
Calcium: 9.1 mg/dL (ref 8.4–10.5)
Chloride: 103 mEq/L (ref 96–112)
Creatinine, Ser: 0.74 mg/dL (ref 0.40–1.20)
GFR: 84.65 mL/min (ref >60.00)
Glucose, Bld: 104 mg/dL — ABNORMAL HIGH (ref 70–99)
Potassium: 3.8 mEq/L (ref 3.5–5.1)
Sodium: 139 mEq/L (ref 135–145)
TOTAL PROTEIN: 7.3 g/dL (ref 6.0–8.3)

## 2015-04-28 LAB — CBC
HEMATOCRIT: 42.7 % (ref 36.0–46.0)
Hemoglobin: 14 g/dL (ref 12.0–15.0)
MCHC: 32.7 g/dL (ref 30.0–36.0)
MCV: 88.9 fl (ref 78.0–100.0)
Platelets: 177 10*3/uL (ref 150.0–400.0)
RBC: 4.8 Mil/uL (ref 3.87–5.11)
RDW: 12.6 % (ref 11.5–15.5)
WBC: 4.4 10*3/uL (ref 4.0–10.5)

## 2015-04-28 LAB — HEMOGLOBIN A1C: Hgb A1c MFr Bld: 5.7 % (ref 4.6–6.5)

## 2015-04-28 LAB — TSH: TSH: 1.06 u[IU]/mL (ref 0.35–4.50)

## 2015-04-28 MED ORDER — RANITIDINE HCL 300 MG PO CAPS: 300.0000 mg | ORAL_CAPSULE | Freq: Every evening | ORAL | Status: DC

## 2015-04-28 NOTE — Progress Notes (Signed)
Pre visit review using our clinic review tool, if applicable. No additional management support is needed unless otherwise documented below in the visit note. 

## 2015-04-28 NOTE — Assessment & Plan Note (Signed)
Avoid offending foods, start probiotics. Do not eat large meals in late evening and consider raising head of bed. Will try alternating Nexium and Rantidine every other day

## 2015-04-28 NOTE — Patient Instructions (Addendum)
Can consider numerous probiotics want a multistrain versions. [employer] or [facility], or online at [email or web address], NOW company 10 strain capsule  Can alternate Ranitidine and Nexium  call Dr [place] to discuss skin options  Food Choices for Gastroesophageal Reflux Disease When you have gastroesophageal reflux disease (GERD), the foods you eat and your eating habits are very important. Choosing the right foods can help ease your discomfort.  WHAT GUIDELINES DO I NEED TO FOLLOW?   Choose fruits, vegetables, whole grains, and low-fat dairy products.   Choose low-fat meat, fish, and poultry.  Limit fats such as oils, salad dressings, butter, nuts, and avocado.   Keep a food diary. This helps you identify foods that cause symptoms.   Avoid foods that cause symptoms. These may be different for everyone.   Eat small meals often instead of 3 large meals a day.   Eat your meals slowly, in a place where you are relaxed.   Limit fried foods.   Cook foods using methods other than frying.   Avoid drinking alcohol.   Avoid drinking large amounts of liquids with your meals.   Avoid bending over or lying down until 2-3 hours after eating.  WHAT FOODS ARE NOT RECOMMENDED?  These are some foods and drinks that may make your symptoms worse: Vegetables Tomatoes. Tomato juice. Tomato and spaghetti sauce. Chili peppers. Onion and garlic. Horseradish. Fruits Oranges, grapefruit, and lemon (fruit and juice). Meats High-fat meats, fish, and poultry. This includes hot dogs, ribs, ham, sausage, salami, and bacon. Dairy Whole milk and chocolate milk. Sour cream. Cream. Butter. Ice cream. Cream cheese.  Drinks Coffee and tea. Bubbly (carbonated) drinks or energy drinks. Condiments Hot sauce. Barbecue sauce.  Sweets/Desserts Chocolate and cocoa. Donuts. Peppermint and spearmint. Fats and Oils High-fat foods. This includes [place] fries and potato  chips. Other Vinegar. Strong spices. This includes black pepper, white pepper, red pepper, cayenne, curry powder, cloves, ginger, and chili powder. The items listed above may not be a complete list of foods and drinks to avoid. Contact your dietitian for more information. Document Released: 03/31/2012 Document Revised: 10/05/2013 Document Reviewed: 08/04/2013 [facility] Patient Information 2015 [place], [place]. This information is not intended to replace advice given to you by your health care provider. Make sure you discuss any questions you have with your health care provider. Preventive Care for Adults A healthy lifestyle and preventive care can promote health and wellness. Preventive health guidelines for women include the following key practices.  A routine yearly physical is a good way to check with your health care provider about your health and preventive screening. It is a chance to share any concerns and updates on your health and to receive a thorough exam.  Visit your dentist for a routine exam and preventive care every 6 months. Brush your teeth twice a day and floss once a day. Good oral hygiene prevents tooth decay and gum disease.  The frequency of eye exams is based on your age, health, family medical history, use of contact lenses, and other factors. Follow your health care provider's recommendations for frequency of eye exams.  Eat a healthy diet. Foods like vegetables, fruits, whole grains, low-fat dairy products, and lean protein foods contain the nutrients you need without too many calories. Decrease your intake of foods high in solid fats, added sugars, and salt. Eat the right amount of calories for you.Get information about a proper diet from your health care provider, if necessary.  Regular physical exercise is one  of the most important things you can do for your health. Most adults should get at least 150 minutes of moderate-intensity exercise (any activity that increases  your heart rate and causes you to sweat) each week. In addition, most adults need muscle-strengthening exercises on 2 or more days a week.  Maintain a healthy weight. The body mass index (BMI) is a screening tool to identify possible weight problems. It provides an estimate of body fat based on height and weight. Your health care provider can find your BMI and can help you achieve or maintain a healthy weight.For adults 20 years and older:  A BMI below 18.5 is considered underweight.  A BMI of 18.5 to 24.9 is normal.  A BMI of 25 to 29.9 is considered overweight.  A BMI of 30 and above is considered obese.  Maintain normal blood lipids and cholesterol levels by exercising and minimizing your intake of saturated fat. Eat a balanced diet with plenty of fruit and vegetables. Blood tests for lipids and cholesterol should begin at age 41 and be repeated every 5 years. If your lipid or cholesterol levels are high, you are over 50, or you are at high risk for heart disease, you may need your cholesterol levels checked more frequently.Ongoing high lipid and cholesterol levels should be treated with medicines if diet and exercise are not working.  If you smoke, find out from your health care provider how to quit. If you do not use tobacco, do not start.  Lung cancer screening is recommended for adults aged 64-80 years who are at high risk for developing lung cancer because of a history of smoking. A yearly low-dose CT scan of the lungs is recommended for people who have at least a 30-pack-year history of smoking and are a current smoker or have quit within the past 15 years. A pack year of smoking is smoking an average of 1 pack of cigarettes a day for 1 year (for example: 1 pack a day for 30 years or 2 packs a day for 15 years). Yearly screening should continue until the smoker has stopped smoking for at least 15 years. Yearly screening should be stopped for people who develop a health problem that would  prevent them from having lung cancer treatment.  If you are pregnant, do not drink alcohol. If you are breastfeeding, be very cautious about drinking alcohol. If you are not pregnant and choose to drink alcohol, do not have more than 1 drink per day. One drink is considered to be 12 ounces (355 mL) of beer, 5 ounces (148 mL) of wine, or 1.5 ounces (44 mL) of liquor.  Avoid use of street drugs. Do not share needles with anyone. Ask for help if you need support or instructions about stopping the use of drugs.  High blood pressure causes heart disease and increases the risk of stroke. Your blood pressure should be checked at least every 1 to 2 years. Ongoing high blood pressure should be treated with medicines if weight loss and exercise do not work.  If you are 55-39 years old, ask your health care provider if you should take aspirin to prevent strokes.  Diabetes screening involves taking a blood sample to check your fasting blood sugar level. This should be done once every 3 years, after age 46, if you are within normal weight and without risk factors for diabetes. Testing should be considered at a younger age or be carried out more frequently if you are overweight and have  at least 1 risk factor for diabetes.  Breast cancer screening is essential preventive care for women. You should practice "breast self-awareness." This means understanding the normal appearance and feel of your breasts and may include breast self-examination. Any changes detected, no matter how small, should be reported to a health care provider. Women in their 1s and 30s should have a clinical breast exam (CBE) by a health care provider as part of a regular health exam every 1 to 3 years. After age 40, women should have a CBE every year. Starting at age 38, women should consider having a mammogram (breast X-ray test) every year. Women who have a family history of breast cancer should talk to their health care provider about genetic  screening. Women at a high risk of breast cancer should talk to their health care providers about having an MRI and a mammogram every year.  Breast cancer gene (BRCA)-related cancer risk assessment is recommended for women who have family members with BRCA-related cancers. BRCA-related cancers include breast, ovarian, tubal, and peritoneal cancers. Having family members with these cancers may be associated with an increased risk for harmful changes (mutations) in the breast cancer genes BRCA1 and BRCA2. Results of the assessment will determine the need for genetic counseling and BRCA1 and BRCA2 testing.  Routine pelvic exams to screen for cancer are no longer recommended for nonpregnant women who are considered low risk for cancer of the pelvic organs (ovaries, uterus, and vagina) and who do not have symptoms. Ask your health care provider if a screening pelvic exam is right for you.  If you have had past treatment for cervical cancer or a condition that could lead to cancer, you need Pap tests and screening for cancer for at least 20 years after your treatment. If Pap tests have been discontinued, your risk factors (such as having a new sexual partner) need to be reassessed to determine if screening should be resumed. Some women have medical problems that increase the chance of getting cervical cancer. In these cases, your health care provider may recommend more frequent screening and Pap tests.  The HPV test is an additional test that may be used for cervical cancer screening. The HPV test looks for the virus that can cause the cell changes on the cervix. The cells collected during the Pap test can be tested for HPV. The HPV test could be used to screen women aged 43 years and older, and should be used in women of any age who have unclear Pap test results. After the age of 26, women should have HPV testing at the same frequency as a Pap test.  Colorectal cancer can be detected and often prevented. Most  routine colorectal cancer screening begins at the age of 44 years and continues through age 53 years. However, your health care provider may recommend screening at an earlier age if you have risk factors for colon cancer. On a yearly basis, your health care provider may provide home test kits to check for hidden blood in the stool. Use of a small camera at the end of a tube, to directly examine the colon (sigmoidoscopy or colonoscopy), can detect the earliest forms of colorectal cancer. Talk to your health care provider about this at age 46, when routine screening begins. Direct exam of the colon should be repeated every 5-10 years through age 17 years, unless early forms of pre-cancerous polyps or small growths are found.  People who are at an increased risk for hepatitis B should  be screened for this virus. You are considered at high risk for hepatitis B if:  You were born in a country where hepatitis B occurs often. Talk with your health care provider about which countries are considered high risk.  Your parents were born in a high-risk country and you have not received a shot to protect against hepatitis B (hepatitis B vaccine).  You have HIV or AIDS.  You use needles to inject street drugs.  You live with, or have sex with, someone who has hepatitis B.  You get hemodialysis treatment.  You take certain medicines for conditions like cancer, organ transplantation, and autoimmune conditions.  Hepatitis C blood testing is recommended for all people born from [birth year] through 1967 and any individual with known risks for hepatitis C.  Practice safe sex. Use condoms and avoid high-risk sexual practices to reduce the spread of sexually transmitted infections (STIs). STIs include gonorrhea, chlamydia, syphilis, trichomonas, herpes, HPV, and human immunodeficiency virus (HIV). Herpes, HIV, and HPV are viral illnesses that have no cure. They can result in disability, cancer, and death.  You should be  screened for sexually transmitted illnesses (STIs) including gonorrhea and chlamydia if:  You are sexually active and are younger than 24 years.  You are older than 24 years and your health care provider tells you that you are at risk for this type of infection.  Your sexual activity has changed since you were last screened and you are at an increased risk for chlamydia or gonorrhea. Ask your health care provider if you are at risk.  If you are at risk of being infected with HIV, it is recommended that you take a prescription medicine daily to prevent HIV infection. This is called preexposure prophylaxis (PrEP). You are considered at risk if:  You are a heterosexual woman, are sexually active, and are at increased risk for HIV infection.  You take drugs by injection.  You are sexually active with a partner who has HIV.  Talk with your health care provider about whether you are at high risk of being infected with HIV. If you choose to begin PrEP, you should first be tested for HIV. You should then be tested every 3 months for as long as you are taking PrEP.  Osteoporosis is a disease in which the bones lose minerals and strength with aging. This can result in serious bone fractures or breaks. The risk of osteoporosis can be identified using a bone density scan. Women ages 38 years and over and women at risk for fractures or osteoporosis should discuss screening with their health care providers. Ask your health care provider whether you should take a calcium supplement or vitamin D to reduce the rate of osteoporosis.  Menopause can be associated with physical symptoms and risks. Hormone replacement therapy is available to decrease symptoms and risks. You should talk to your health care provider about whether hormone replacement therapy is right for you.  Use sunscreen. Apply sunscreen liberally and repeatedly throughout the day. You should seek shade when your shadow is shorter than you. Protect  yourself by wearing long sleeves, pants, a wide-brimmed hat, and sunglasses year round, whenever you are outdoors.  Once a month, do a whole body skin exam, using a mirror to look at the skin on your back. Tell your health care provider of new moles, moles that have irregular borders, moles that are larger than a pencil eraser, or moles that have changed in shape or color.  Stay current with  required vaccines (immunizations).  Influenza vaccine. All adults should be immunized every year.  Tetanus, diphtheria, and acellular pertussis (Td, Tdap) vaccine. Pregnant women should receive 1 dose of Tdap vaccine during each pregnancy. The dose should be obtained regardless of the length of time since the last dose. Immunization is preferred during the 27th-36th week of gestation. An adult who has not previously received Tdap or who does not know her vaccine status should receive 1 dose of Tdap. This initial dose should be followed by tetanus and diphtheria toxoids (Td) booster doses every 10 years. Adults with an unknown or incomplete history of completing a 3-dose immunization series with Td-containing vaccines should begin or complete a primary immunization series including a Tdap dose. Adults should receive a Td booster every 10 years.  Varicella vaccine. An adult without evidence of immunity to varicella should receive 2 doses or a second dose if she has previously received 1 dose. Pregnant females who do not have evidence of immunity should receive the first dose after pregnancy. This first dose should be obtained before leaving the health care facility. The second dose should be obtained 4-8 weeks after the first dose.  Human papillomavirus (HPV) vaccine. Females aged 13-26 years who have not received the vaccine previously should obtain the 3-dose series. The vaccine is not recommended for use in pregnant females. However, pregnancy testing is not needed before receiving a dose. If a female is found to be  pregnant after receiving a dose, no treatment is needed. In that case, the remaining doses should be delayed until after the pregnancy. Immunization is recommended for any person with an immunocompromised condition through the age of 60 years if she did not get any or all doses earlier. During the 3-dose series, the second dose should be obtained 4-8 weeks after the first dose. The third dose should be obtained 24 weeks after the first dose and 16 weeks after the second dose.  Zoster vaccine. One dose is recommended for adults aged 51 years or older unless certain conditions are present.  Measles, mumps, and rubella (MMR) vaccine. Adults born before [birth year] generally are considered immune to measles and mumps. Adults born in [birth year] or later should have 1 or more doses of MMR vaccine unless there is a contraindication to the vaccine or there is laboratory evidence of immunity to each of the three diseases. A routine second dose of MMR vaccine should be obtained at least 28 days after the first dose for students attending postsecondary schools, health care workers, or international travelers. People who received inactivated measles vaccine or an unknown type of measles vaccine during 1963-1967 should receive 2 doses of MMR vaccine. People who received inactivated mumps vaccine or an unknown type of mumps vaccine before 1979 and are at high risk for mumps infection should consider immunization with 2 doses of MMR vaccine. For females of childbearing age, rubella immunity should be determined. If there is no evidence of immunity, females who are not pregnant should be vaccinated. If there is no evidence of immunity, females who are pregnant should delay immunization until after pregnancy. Unvaccinated health care workers born before [birth year] who lack laboratory evidence of measles, mumps, or rubella immunity or laboratory confirmation of disease should consider measles and mumps immunization with 2 doses of MMR vaccine or  rubella immunization with 1 dose of MMR vaccine.  Pneumococcal 13-valent conjugate (PCV13) vaccine. When indicated, a person who is uncertain of her immunization history and has no record of immunization should receive  the PCV13 vaccine. An adult aged 66 years or older who has certain medical conditions and has not been previously immunized should receive 1 dose of PCV13 vaccine. This PCV13 should be followed with a dose of pneumococcal polysaccharide (PPSV23) vaccine. The PPSV23 vaccine dose should be obtained at least 8 weeks after the dose of PCV13 vaccine. An adult aged 28 years or older who has certain medical conditions and previously received 1 or more doses of PPSV23 vaccine should receive 1 dose of PCV13. The PCV13 vaccine dose should be obtained 1 or more years after the last PPSV23 vaccine dose.  Pneumococcal polysaccharide (PPSV23) vaccine. When PCV13 is also indicated, PCV13 should be obtained first. All adults aged 44 years and older should be immunized. An adult younger than age 26 years who has certain medical conditions should be immunized. Any person who resides in a nursing home or long-term care facility should be immunized. An adult smoker should be immunized. People with an immunocompromised condition and certain other conditions should receive both PCV13 and PPSV23 vaccines. People with human immunodeficiency virus (HIV) infection should be immunized as soon as possible after diagnosis. Immunization during chemotherapy or radiation therapy should be avoided. Routine use of PPSV23 vaccine is not recommended for American Indians, [place]atives, or people younger than 65 years unless there are medical conditions that require PPSV23 vaccine. When indicated, people who have unknown immunization and have no record of immunization should receive PPSV23 vaccine. One-time revaccination 5 years after the first dose of PPSV23 is recommended for people aged 19-64 years who have chronic kidney  failure, nephrotic syndrome, asplenia, or immunocompromised conditions. People who received 1-2 doses of PPSV23 before age 23 years should receive another dose of PPSV23 vaccine at age 7 years or later if at least 5 years have passed since the previous dose. Doses of PPSV23 are not needed for people immunized with PPSV23 at or after age 21 years.  Meningococcal vaccine. Adults with asplenia or persistent complement component deficiencies should receive 2 doses of quadrivalent meningococcal conjugate (MenACWY-D) vaccine. The doses should be obtained at least 2 months apart. Microbiologists working with certain meningococcal bacteria, [place] recruits, people at risk during an outbreak, and people who travel to or live in countries with a high rate of meningitis should be immunized. A first-year college student up through age 75 years who is living in a residence hall should receive a dose if she did not receive a dose on or after her 16th birthday. Adults who have certain high-risk conditions should receive one or more doses of vaccine.  Hepatitis A vaccine. Adults who wish to be protected from this disease, have certain high-risk conditions, work with hepatitis A-infected animals, work in hepatitis A research labs, or travel to or work in countries with a high rate of hepatitis A should be immunized. Adults who were previously unvaccinated and who anticipate close contact with an international adoptee during the first 60 days after arrival in the [place] from a country with a high rate of hepatitis A should be immunized.  Hepatitis B vaccine. Adults who wish to be protected from this disease, have certain high-risk conditions, may be exposed to blood or other infectious body fluids, are household contacts or sex partners of hepatitis B positive people, are clients or workers in certain care facilities, or travel to or work in countries with a high rate of hepatitis B should be immunized.  Haemophilus  influenzae type b (Hib) vaccine. A previously unvaccinated person with  asplenia or sickle cell disease or having a scheduled splenectomy should receive 1 dose of Hib vaccine. Regardless of previous immunization, a recipient of a hematopoietic stem cell transplant should receive a 3-dose series 6-12 months after her successful transplant. Hib vaccine is not recommended for adults with HIV infection. Preventive Services / Frequency Ages 4 to 39 years  Blood pressure check.** / Every 1 to 2 years.  Lipid and cholesterol check.** / Every 5 years beginning at age 22.  Clinical breast exam.** / Every 3 years for women in their 48s and 30s.  BRCA-related cancer risk assessment.** / For women who have family members with a BRCA-related cancer (breast, ovarian, tubal, or peritoneal cancers).  Pap test.** / Every 2 years from ages 95 through 88. Every 3 years starting at age 66 through age 66 or 15 with a history of 3 consecutive normal Pap tests.  HPV screening.** / Every 3 years from ages 53 through ages 39 to 36 with a history of 3 consecutive normal Pap tests.  Hepatitis C blood test.** / For any individual with known risks for hepatitis C.  Skin self-exam. / Monthly.  Influenza vaccine. / Every year.  Tetanus, diphtheria, and acellular pertussis (Tdap, Td) vaccine.** / Consult your health care provider. Pregnant women should receive 1 dose of Tdap vaccine during each pregnancy. 1 dose of Td every 10 years.  Varicella vaccine.** / Consult your health care provider. Pregnant females who do not have evidence of immunity should receive the first dose after pregnancy.  HPV vaccine. / 3 doses over 6 months, if 26 and younger. The vaccine is not recommended for use in pregnant females. However, pregnancy testing is not needed before receiving a dose.  Measles, mumps, rubella (MMR) vaccine.** / You need at least 1 dose of MMR if you were born in 1957 or later. You may also need a 2nd dose. For  females of childbearing age, rubella immunity should be determined. If there is no evidence of immunity, females who are not pregnant should be vaccinated. If there is no evidence of immunity, females who are pregnant should delay immunization until after pregnancy.  Pneumococcal 13-valent conjugate (PCV13) vaccine.** / Consult your health care provider.  Pneumococcal polysaccharide (PPSV23) vaccine.** / 1 to 2 doses if you smoke cigarettes or if you have certain conditions.  Meningococcal vaccine.** / 1 dose if you are age 76 to 61 years and a Orthoptist living in a residence hall, or have one of several medical conditions, you need to get vaccinated against meningococcal disease. You may also need additional booster doses.  Hepatitis A vaccine.** / Consult your health care provider.  Hepatitis B vaccine.** / Consult your health care provider.  Haemophilus influenzae type b (Hib) vaccine.** / Consult your health care provider. Ages 58 to 64 years  Blood pressure check.** / Every 1 to 2 years.  Lipid and cholesterol check.** / Every 5 years beginning at age 61 years.  Lung cancer screening. / Every year if you are aged 55-80 years and have a 30-pack-year history of smoking and currently smoke or have quit within the past 15 years. Yearly screening is stopped once you have quit smoking for at least 15 years or develop a health problem that would prevent you from having lung cancer treatment.  Clinical breast exam.** / Every year after age 96 years.  BRCA-related cancer risk assessment.** / For women who have family members with a BRCA-related cancer (breast, ovarian, tubal, or peritoneal cancers).  Mammogram.** / Every year beginning at age 44 years and continuing for as long as you are in good health. Consult with your health care provider.  Pap test.** / Every 3 years starting at age 35 years through age 46 or 70 years with a history of 3 consecutive normal Pap  tests.  HPV screening.** / Every 3 years from ages 21 years through ages 1 to 1 years with a history of 3 consecutive normal Pap tests.  Fecal occult blood test (FOBT) of stool. / Every year beginning at age 32 years and continuing until age 80 years. You may not need to do this test if you get a colonoscopy every 10 years.  Flexible sigmoidoscopy or colonoscopy.** / Every 5 years for a flexible sigmoidoscopy or every 10 years for a colonoscopy beginning at age 77 years and continuing until age 54 years.  Hepatitis C blood test.** / For all people born from [birth year] through 1967 and any individual with known risks for hepatitis C.  Skin self-exam. / Monthly.  Influenza vaccine. / Every year.  Tetanus, diphtheria, and acellular pertussis (Tdap/Td) vaccine.** / Consult your health care provider. Pregnant women should receive 1 dose of Tdap vaccine during each pregnancy. 1 dose of Td every 10 years.  Varicella vaccine.** / Consult your health care provider. Pregnant females who do not have evidence of immunity should receive the first dose after pregnancy.  Zoster vaccine.** / 1 dose for adults aged 29 years or older.  Measles, mumps, rubella (MMR) vaccine.** / You need at least 1 dose of MMR if you were born in 1957 or later. You may also need a 2nd dose. For females of childbearing age, rubella immunity should be determined. If there is no evidence of immunity, females who are not pregnant should be vaccinated. If there is no evidence of immunity, females who are pregnant should delay immunization until after pregnancy.  Pneumococcal 13-valent conjugate (PCV13) vaccine.** / Consult your health care provider.  Pneumococcal polysaccharide (PPSV23) vaccine.** / 1 to 2 doses if you smoke cigarettes or if you have certain conditions.  Meningococcal vaccine.** / Consult your health care provider.  Hepatitis A vaccine.** / Consult your health care provider.  Hepatitis B vaccine.** / Consult your  health care provider.  Haemophilus influenzae type b (Hib) vaccine.** / Consult your health care provider. Ages 37 years and over  Blood pressure check.** / Every 1 to 2 years.  Lipid and cholesterol check.** / Every 5 years beginning at age 52 years.  Lung cancer screening. / Every year if you are aged 66-80 years and have a 30-pack-year history of smoking and currently smoke or have quit within the past 15 years. Yearly screening is stopped once you have quit smoking for at least 15 years or develop a health problem that would prevent you from having lung cancer treatment.  Clinical breast exam.** / Every year after age 7 years.  BRCA-related cancer risk assessment.** / For women who have family members with a BRCA-related cancer (breast, ovarian, tubal, or peritoneal cancers).  Mammogram.** / Every year beginning at age 63 years and continuing for as long as you are in good health. Consult with your health care provider.  Pap test.** / Every 3 years starting at age 63 years through age 43 or 19 years with 3 consecutive normal Pap tests. Testing can be stopped between 65 and 70 years with 3 consecutive normal Pap tests and no abnormal Pap or HPV tests in the past 10 years.  HPV screening.** / Every 3 years from ages 26 years through ages 70 or 56 years with a history of 3 consecutive normal Pap tests. Testing can be stopped between 65 and 70 years with 3 consecutive normal Pap tests and no abnormal Pap or HPV tests in the past 10 years.  Fecal occult blood test (FOBT) of stool. / Every year beginning at age 67 years and continuing until age 47 years. You may not need to do this test if you get a colonoscopy every 10 years.  Flexible sigmoidoscopy or colonoscopy.** / Every 5 years for a flexible sigmoidoscopy or every 10 years for a colonoscopy beginning at age 36 years and continuing until age 31 years.  Hepatitis C blood test.** / For all people born from [birth year] through 1967 and any  individual with known risks for hepatitis C.  Osteoporosis screening.** / A one-time screening for women ages 17 years and over and women at risk for fractures or osteoporosis.  Skin self-exam. / Monthly.  Influenza vaccine. / Every year.  Tetanus, diphtheria, and acellular pertussis (Tdap/Td) vaccine.** / 1 dose of Td every 10 years.  Varicella vaccine.** / Consult your health care provider.  Zoster vaccine.** / 1 dose for adults aged 21 years or older.  Pneumococcal 13-valent conjugate (PCV13) vaccine.** / Consult your health care provider.  Pneumococcal polysaccharide (PPSV23) vaccine.** / 1 dose for all adults aged 12 years and older.  Meningococcal vaccine.** / Consult your health care provider.  Hepatitis A vaccine.** / Consult your health care provider.  Hepatitis B vaccine.** / Consult your health care provider.  Haemophilus influenzae type b (Hib) vaccine.** / Consult your health care provider. ** Family history and personal history of risk and conditions may change your health care provider's recommendations. Document Released: 11/26/2001 Document Revised: 02/14/2014 Document Reviewed: 02/25/2011 [facility] Patient Information 2015 [place], [place]. This information is not intended to replace advice given to you by your health care provider. Make sure you discuss any questions you have with your health care provider.

## 2015-04-28 NOTE — Assessment & Plan Note (Signed)
Is concerned that it is returning, will check CBC

## 2015-04-28 NOTE — Assessment & Plan Note (Addendum)
Patient encouraged to maintain heart healthy diet, regular exercise, adequate sleep. Consider daily probiotics. Take medications as prescribed. Sees dermatology annually. Given and reviewed copy of ACP documents from [employer] and encouraged to complete and return

## 2015-05-01 ENCOUNTER — Other Ambulatory Visit: Payer: Self-pay | Admitting: Family Medicine

## 2015-05-01 ENCOUNTER — Telehealth: Payer: Self-pay

## 2015-05-01 MED ORDER — PITAVASTATIN CALCIUM 4 MG PO TABS: 4.0000 mg | ORAL_TABLET | Freq: Every day | ORAL | Status: DC

## 2015-05-01 NOTE — Telephone Encounter (Signed)
-----   Message from Mosie Lukes, MD sent at 04/29/2015  8:31 AM EDT ----- OK to increase Livalo 4 mg po qhs disp #30 with 3

## 2015-05-01 NOTE — Telephone Encounter (Signed)
Pt notified. No questions or concerns at this time

## 2015-05-09 ENCOUNTER — Other Ambulatory Visit (HOSPITAL_BASED_OUTPATIENT_CLINIC_OR_DEPARTMENT_OTHER): Payer: Commercial Managed Care - PPO

## 2015-05-10 ENCOUNTER — Inpatient Hospital Stay (HOSPITAL_BASED_OUTPATIENT_CLINIC_OR_DEPARTMENT_OTHER): Admission: RE | Admit: 2015-05-10 | Payer: Commercial Managed Care - PPO | Source: Ambulatory Visit

## 2015-05-10 ENCOUNTER — Other Ambulatory Visit (HOSPITAL_BASED_OUTPATIENT_CLINIC_OR_DEPARTMENT_OTHER): Payer: Commercial Managed Care - PPO

## 2015-05-10 ENCOUNTER — Other Ambulatory Visit: Payer: Self-pay | Admitting: Family Medicine

## 2015-05-12 ENCOUNTER — Other Ambulatory Visit (HOSPITAL_BASED_OUTPATIENT_CLINIC_OR_DEPARTMENT_OTHER): Payer: Commercial Managed Care - PPO

## 2015-05-15 NOTE — Assessment & Plan Note (Signed)
minimize simple carbs. Increase exercise as tolerated.  

## 2015-05-15 NOTE — Progress Notes (Signed)
Toni Daniel  [id number] 1955/11/26 05/15/2015      Progress Note-Follow Up  Subjective  Chief Complaint  Chief Complaint  Patient presents with  . Annual Exam    HPI  Patient is a 59 y.o. female in today for routine medical care. atient is in today for annual exam. Requesting a refill on Retin A for wrinkles. No recent illness. Is noting palpiations qhs at times. No associated symptoms. Denies CP/palp/SOB/HA/congestion/fevers/GI or GU c/o. Taking meds as prescribed  Past Medical History  Diagnosis Date  . Migraine   . Chicken pox as a child  . Anemia 2004  . Hyperlipidemia 2003  . Perimenopause   . Heart palpitations   . Diarrhea 07/20/2012  . Elevated BP 07/20/2012  . GERD (gastroesophageal reflux disease)   . Preventative health care 11/08/2013  . BCC (basal cell carcinoma), face fall 2015    Past Surgical History  Procedure Laterality Date  . Cesarean section  1986 and 1990  . Tonsillectomy  16  . Wisdom tooth extraction  59 yrs old  . Skin cancer excision      2 BCC from face, Mohs procedure    Family History  Problem Relation Age of Onset  . Stroke Mother     post- operative- stroke  . Other Mother     mass in uterus- unsure if ca  . Hyperlipidemia Mother   . Heart disease Father   . COPD Father     smoked when he was young- hadn't smoked in 7 yrs  . Hyperlipidemia Sister   . Heart disease Sister   . Cancer Maternal Grandmother     gall bladder  . Pneumonia Paternal Grandfather   . Cancer Sister     breast or uterine?  Marland Kitchen Asthma Son     History   Social History  . Marital Status: Married    Spouse Name: N/A  . Number of Children: N/A  . Years of Education: N/A   Occupational History  . Not on file.   Social History Main Topics  . Smoking status: Never Smoker   . Smokeless tobacco: Never Used  . Alcohol Use: Yes     Comment: 3-4 glasses of wine weekly  . Drug Use: No  . Sexual Activity:    Partners: Male     Comment: lives  husband, no major dietary restrictions avoids red meat   Other Topics Concern  . Not on file   Social History Narrative    Current Outpatient Prescriptions on File Prior to Visit  Medication Sig Dispense Refill  . esomeprazole (NEXIUM) 40 MG capsule Take 1 capsule (40 mg total) by mouth daily before breakfast. Pt needs appt for addt refills 90 capsule 0  . Probiotic Product (PROBIOTIC DAILY PO) Take 1 tablet by mouth daily.    Marland Kitchen tretinoin (RETIN-A) 0.025 % cream Apply topically at bedtime. 45 g 0  . VAGIFEM 10 MCG TABS vaginal tablet Insert 1 tablet twice a week.  12  . meloxicam (MOBIC) 7.5 MG tablet TAKE 1 TABLET (7.5 MG TOTAL) BY MOUTH DAILY. (Patient not taking: Reported on 04/28/2015) 14 tablet 0  . SUMAtriptan (IMITREX) 20 MG/ACT nasal spray Place 1 spray (20 mg total) into the nose every 2 (two) hours as needed for migraine or headache. Max of 2 sprays in 24 hours (Patient not taking: Reported on 04/28/2015) 1 Inhaler 2   No current facility-administered medications on file prior to visit.    Allergies  Allergen Reactions  .  Crestor [Rosuvastatin]     myalgias  . Lipitor [Atorvastatin]     myalgias  . Latex Rash    Review of Systems  Review of Systems  Constitutional: Negative for fever, chills and malaise/fatigue.  HENT: Negative for congestion, hearing loss and nosebleeds.   Eyes: Negative for discharge.  Respiratory: Positive for cough. Negative for sputum production, shortness of breath and wheezing.   Cardiovascular: Positive for palpitations. Negative for chest pain and leg swelling.  Gastrointestinal: Positive for heartburn. Negative for nausea, vomiting, abdominal pain, diarrhea, constipation and blood in stool.  Genitourinary: Negative for dysuria, urgency, frequency and hematuria.  Musculoskeletal: Negative for myalgias, back pain and falls.  Skin: Negative for rash.  Neurological: Negative for dizziness, tremors, sensory change, focal weakness, loss of  consciousness, weakness and headaches.  Endo/Heme/Allergies: Negative for polydipsia. Does not bruise/bleed easily.  Psychiatric/Behavioral: Negative for depression and suicidal ideas. The patient is not nervous/anxious and does not have insomnia.     Objective  BP 138/90 mmHg  Pulse 69  Temp(Src) 98 F (36.7 C) (Oral)  Ht 5' 5.5" (1.664 m)  Wt 156 lb 2 oz (70.818 kg)  BMI 25.58 kg/m2  SpO2 98%  Physical Exam  Physical Exam  Constitutional: She is oriented to person, place, and time and well-developed, well-nourished, and in no distress. No distress.  HENT:  Head: Normocephalic and atraumatic.  Right Ear: External ear normal.  Left Ear: External ear normal.  Nose: Nose normal.  Mouth/Throat: Oropharynx is clear and moist. No oropharyngeal exudate.  Eyes: Conjunctivae are normal. Pupils are equal, round, and reactive to light. Right eye exhibits no discharge. Left eye exhibits no discharge. No scleral icterus.  Neck: Normal range of motion. Neck supple. No thyromegaly present.  Cardiovascular: Normal rate, regular rhythm, normal heart sounds and intact distal pulses.   No murmur heard. Pulmonary/Chest: Effort normal and breath sounds normal. No respiratory distress. She has no wheezes. She has no rales.  Abdominal: Soft. Bowel sounds are normal. She exhibits no distension and no mass. There is no tenderness.  Musculoskeletal: Normal range of motion. She exhibits no edema or tenderness.  Lymphadenopathy:    She has no cervical adenopathy.  Neurological: She is alert and oriented to person, place, and time. She has normal reflexes. No cranial nerve deficit. Coordination normal.  Skin: Skin is warm and dry. No rash noted. She is not diaphoretic.  Psychiatric: Mood, memory and affect normal.    Lab Results  Component Value Date   TSH 1.06 04/28/2015   Lab Results  Component Value Date   WBC 4.4 04/28/2015   HGB 14.0 04/28/2015   HCT 42.7 04/28/2015   MCV 88.9 04/28/2015    PLT 177.0 04/28/2015   Lab Results  Component Value Date   CREATININE 0.74 04/28/2015   BUN 19 04/28/2015   NA 139 04/28/2015   K 3.8 04/28/2015   CL 103 04/28/2015   CO2 30 04/28/2015   Lab Results  Component Value Date   ALT 20 04/28/2015   AST 21 04/28/2015   ALKPHOS 76 04/28/2015   BILITOT 0.4 04/28/2015   Lab Results  Component Value Date   CHOL 255* 04/28/2015   Lab Results  Component Value Date   HDL 51.50 04/28/2015   Lab Results  Component Value Date   LDLCALC 183* 04/28/2015   Lab Results  Component Value Date   TRIG 102.0 04/28/2015   Lab Results  Component Value Date   CHOLHDL 5 04/28/2015  Assessment & Plan  Anemia Is concerned that it is returning, will check CBC  GERD (gastroesophageal reflux disease) Avoid offending foods, start probiotics. Do not eat large meals in late evening and consider raising head of bed. Will try alternating Nexium and Rantidine every other day  Preventative health care Patient encouraged to maintain heart healthy diet, regular exercise, adequate sleep. Consider daily probiotics. Take medications as prescribed. Sees dermatology annually. Given and reviewed copy of ACP documents from [employer] and encouraged to complete and return  Hyperlipidemia Tolerating statin, encouraged heart healthy diet, avoid trans fats, minimize simple carbs and saturated fats. Increase exercise as tolerated  Elevated BP Improved on recheck. Well controlled, no changes to meds. Encouraged heart healthy diet such as the DASH diet and exercise as tolerated.   Hyperglycemia minimize simple carbs. Increase exercise as tolerated.

## 2015-05-15 NOTE — Assessment & Plan Note (Signed)
Improved on recheck. Well controlled, no changes to meds. Encouraged heart healthy diet such as the DASH diet and exercise as tolerated.  

## 2015-05-15 NOTE — Assessment & Plan Note (Signed)
Tolerating statin, encouraged heart healthy diet, avoid trans fats, minimize simple carbs and saturated fats. Increase exercise as tolerated 

## 2015-08-29 ENCOUNTER — Other Ambulatory Visit: Payer: Self-pay | Admitting: Family Medicine

## 2015-08-29 MED ORDER — VAGIFEM 10 MCG VA TABS: 10.0000 ug | ORAL_TABLET | Freq: Every day | VAGINAL | Status: DC

## 2015-08-29 NOTE — Telephone Encounter (Signed)
Caller name: Self    Can be reached: [phone]  Pharmacy: [facility] [address] [place], [place] [address]  Phone: [phone]  Fax: [phone]  Reason for call: VAGIFEM 10 MCG TABS vaginal tablet [id number] has expired and patient has changed pharmacies to above. Request prescription sent to above.

## 2015-08-31 MED ORDER — VAGIFEM 10 MCG VA TABS: 10.0000 ug | ORAL_TABLET | Freq: Every day | VAGINAL | Status: AC

## 2015-08-31 NOTE — Addendum Note (Signed)
Addended by: Sharon Seller B on: 08/31/2015 04:54 PM   Modules accepted: Orders

## 2015-09-04 ENCOUNTER — Telehealth: Payer: Self-pay | Admitting: Family Medicine

## 2015-09-04 NOTE — Telephone Encounter (Signed)
Called [facility] informed to cancel order as this prescription most recently sent to a local pharmacy .

## 2015-09-04 NOTE — Telephone Encounter (Signed)
Caller name: Tye Maryland   Relationship to patient:  [facility]   Can be reached: [phone]    Reason for call: They called in to get clarification on a pt's Rx. VAGIFEM . They asked if someone could call them back and use reference # [id number]

## 2015-09-22 ENCOUNTER — Telehealth: Payer: Self-pay | Admitting: Family Medicine

## 2015-09-22 NOTE — Telephone Encounter (Signed)
Relation to [id number] Call back [phone]   Reason for call:  Patient is having BP concerns,  Patient  established a new patient appointment with another provider due to patient relocating.  Patient new patient appointment is not until January and the new doctor office advised patient to go to an urgent care, patient went to an urgent care and they advised they do not treat BP concerns. Patient in need of clinical advice.

## 2015-09-23 NOTE — Telephone Encounter (Signed)
So much depends on how high her BP is. If she sits quiet for 15 minutes and checks it and it is consistently above 150/95 she should find somewhere to be seen, if below that can be seen in January most likely. If she feels badly with headache, chest pain etc she should also be seen very important for to get 6-8 hours of sleep nightly, minimize sodium and avoid caffeine.

## 2015-09-25 ENCOUNTER — Telehealth: Payer: Self-pay | Admitting: Family Medicine

## 2015-09-25 NOTE — Telephone Encounter (Signed)
[facility] - Client [facility]  Patient Name: Toni Daniel  DOB: 02/26/1956    Initial Comment Caller states, has high blood pressure issues, dizziness, headache, 160/98 today    Nurse Assessment  Nurse: Wayne Sever, RN, Tillie Rung Date/Time (Eastern Time): 09/25/2015 10:35:47 AM  Confirm and document reason for call. If symptomatic, describe symptoms. ---Caller left a message for someone to call her on Friday. She is having some BP issues. She has never had high BP before. She noticed some dizziness the day after Thanksgiving. She felt off and she had a headache. She has not seen the office yet because she has a primary doctor's appointment in June. [facility] went into [facility] Friday. Her BP was 150/90. She says the [facility] refused to treat her BP. They told her to go to ER. Caller is trying to figure out what to do. She is feeling bad this morning. She has seen Dr. Charlett Blake before, it's that she moved to [place] and she is waiting on an appointment at new practice in [place].  Has the patient traveled out of the country within the last 30 days? ---Not Applicable  Does the patient have any new or worsening symptoms? ---Yes  Will a triage be completed? ---Yes  Related visit to physician within the last 2 weeks? ---Yes  Does the PT have any chronic conditions? (i.e. diabetes, asthma, etc.) ---Yes  List chronic conditions. ---GERD  Is this a behavioral health or substance abuse call? ---No     Guidelines    Guideline Title Affirmed Question Affirmed Notes  High Blood Pressure [1] BP ? 160 / 100 AND [2] cardiac or neurologic symptoms (e.g., chest pain, difficulty breathing, unsteady gait, blurred vision)    Final Disposition User   Go to ED Now Wayne Sever, RN, Tillie Rung    Comments  Caller's BP was 168/118 this morning and she is having dizziness.   Referrals  GO TO FACILITY OTHER - SPECIFY   Disagree/Comply: Comply

## 2015-09-25 NOTE — Telephone Encounter (Signed)
Please follow up with patient directly [name], thank you she would like to speak with a CMA or nurse

## 2015-09-25 NOTE — Telephone Encounter (Signed)
Called left message to call back 

## 2015-09-25 NOTE — Telephone Encounter (Signed)
Called to follow up with patient.  Left a message for call back.   

## 2015-09-26 MED ORDER — METOPROLOL SUCCINATE ER 25 MG PO TB24: 25.0000 mg | ORAL_TABLET | Freq: Every day | ORAL | Status: DC

## 2015-09-26 NOTE — Telephone Encounter (Signed)
So the problem is if I start something I need to check her vitals. I could start her on some Metoprolol or HCTZ but to keep it ongoing til spring would need to check her bp and pulse in about a month to keep it going. If she can get up once after we start the med I can help manage this.

## 2015-09-26 NOTE — Telephone Encounter (Signed)
Sent in metoprolol  And called the patient informed of PCP instructions.

## 2015-09-26 NOTE — Telephone Encounter (Signed)
Called the patient back.  She was advised yesterday to go to the ER in [place] where she now lives.  They did lab work/and CT Scan.  They stated they found nothing to cause the high BP and stated they thought it was not that high.  She said yesterday it was 160/118.  Symptoms at this time are headache/dizziness.  She has a new patient appt. With new PCP not until the spring in [place].  She is not sure what to do at this time.  She is going to have labs/results of test run at the ER sent to Dr. Charlett Blake.  She [facility] also [facility] where they told her they do not treat High Blood pressure.  Advise

## 2015-09-26 NOTE — Telephone Encounter (Signed)
Great send in Metoprolol XL 25 mg po daily disp #30 with 1 rf, have her check her bp and pulse weekly if she can. Want pulse between 55 and 90 and her BP between 100-140 over 60-90. Let us know if any trouble

## 2015-09-26 NOTE — Telephone Encounter (Signed)
Called the patient and she is ok with starting one of the BP meds. And agreed to schedule a Nurse visit in one month to check vitals.  Please send prescription to the [facility] in [place] (this pharmacy is one of the pharmacies on her list ,)

## 2015-09-27 ENCOUNTER — Other Ambulatory Visit: Payer: Self-pay | Admitting: Family Medicine

## 2015-10-23 ENCOUNTER — Telehealth: Payer: Self-pay | Admitting: Family Medicine

## 2015-10-23 NOTE — Telephone Encounter (Signed)
This should probably have been routed to RN doing BP checks so they could have her stop by today as she requested if they had room. I am not seeing note til after hours. OK with me to have an RN visit to check pressure

## 2015-10-23 NOTE — Telephone Encounter (Signed)
Relation to [id number] Call back number:[phone]   Reason for call:  Patient states PCP wanted her BP checked by nurse due to medication that was prescribed, patient states she's in the area and would like to be seen today by the nurse for a BP check. Please advise

## 2015-10-24 NOTE — Telephone Encounter (Signed)
Unable to reach patient at this time. Left message for patient to return call when available.    

## 2015-10-24 NOTE — Telephone Encounter (Addendum)
Patient relocated and lives an hour away and would like to discuss BP don't know when she can schedule an appointment, please advise

## 2015-11-08 ENCOUNTER — Telehealth: Payer: Self-pay | Admitting: *Deleted

## 2015-11-08 NOTE — Telephone Encounter (Signed)
Received PA from [employer] for Livalo; initiated on CMM and received real-time Approval via web; faxed to pharmacy/SLS 01/25

## 2015-11-10 NOTE — Telephone Encounter (Signed)
Received letter documentation from Approval via [facility]; valid 10/09/15 through 11/07/2016, sent to scan/SLS 01/27

## 2015-11-23 ENCOUNTER — Other Ambulatory Visit: Payer: Self-pay | Admitting: Family Medicine

## 2015-11-23 MED ORDER — METOPROLOL SUCCINATE ER 25 MG PO TB24: 25.0000 mg | ORAL_TABLET | Freq: Every day | ORAL | Status: AC

## 2016-02-19 ENCOUNTER — Other Ambulatory Visit: Payer: Self-pay | Admitting: Family Medicine

## 2016-04-15 IMAGING — CT CT ABD-PELV W/ CM
2 of 5 series · 16 of 46 positions shown, 18 images · IV contrast (APPLIED)
Comparison: None.

CLINICAL DATA: 59-year-old female with left flank and epigastric
pain for 2-3 weeks with bloating and diarrhea. Initial encounter.

EXAM:
CT ABDOMEN AND PELVIS WITH CONTRAST
TECHNIQUE: Multidetector CT imaging of the abdomen and pelvis was performed
using the standard protocol following bolus administration of
intravenous contrast.
CONTRAST:  100mL OMNIPAQUE IOHEXOL 300 MG/ML  SOLN

[Series 2: abd/pelvis 5.0 b31f · axial · 0.77mm/px · z∈[-522,-97]mm · 13 of 95 slices shown, 15 images]
[im 5/95  soft-tissue]
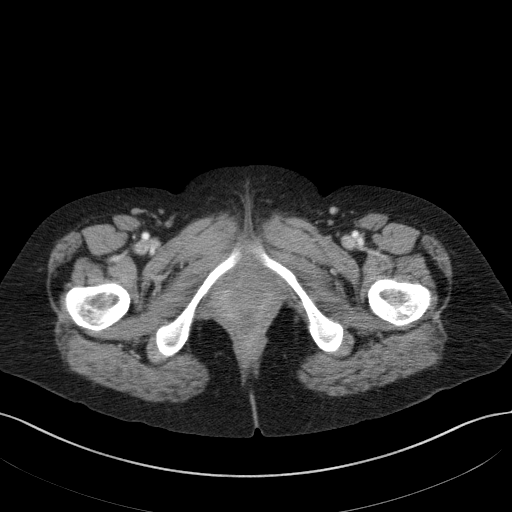
[im 5/95  bone]
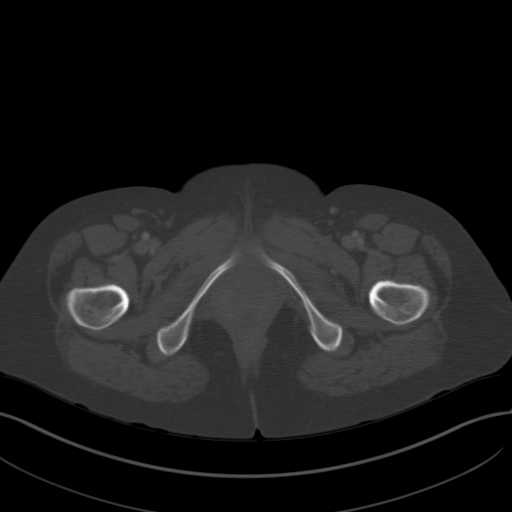
[im 15/95  soft-tissue]
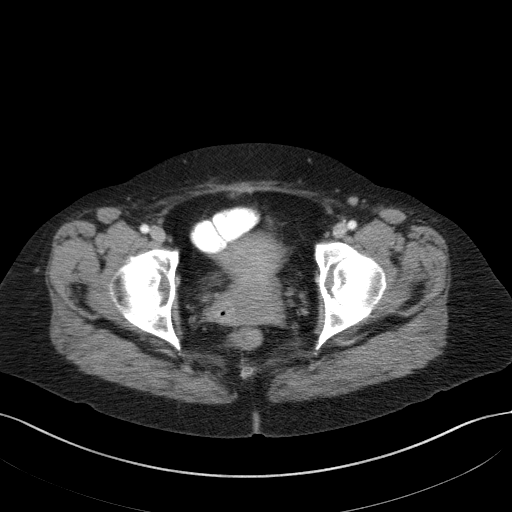
[im 20/95  soft-tissue]
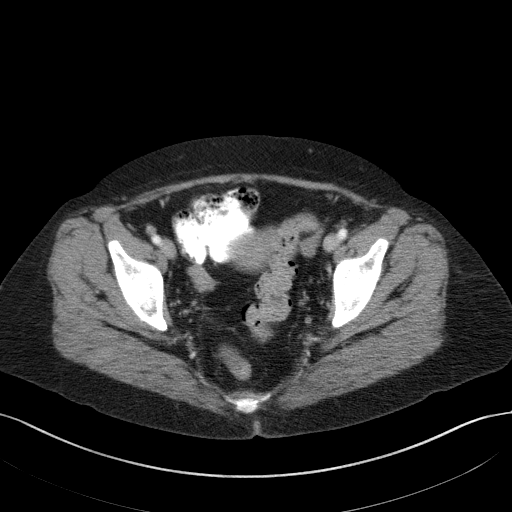
[im 25/95  soft-tissue]
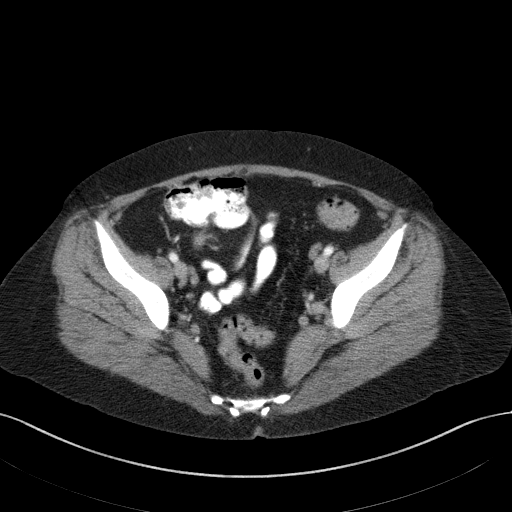
[im 35/95  soft-tissue]
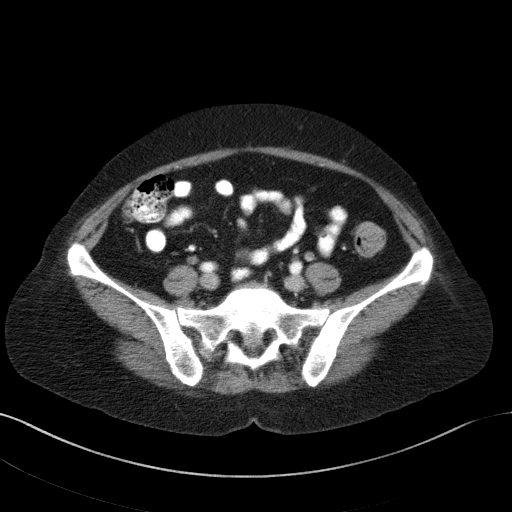
[im 40/95  soft-tissue]
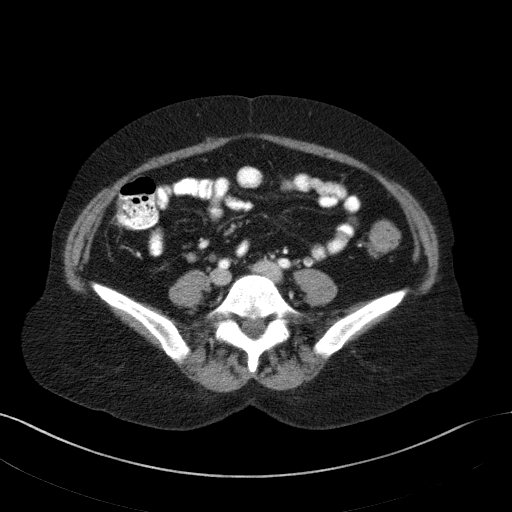
[im 50/95  soft-tissue]
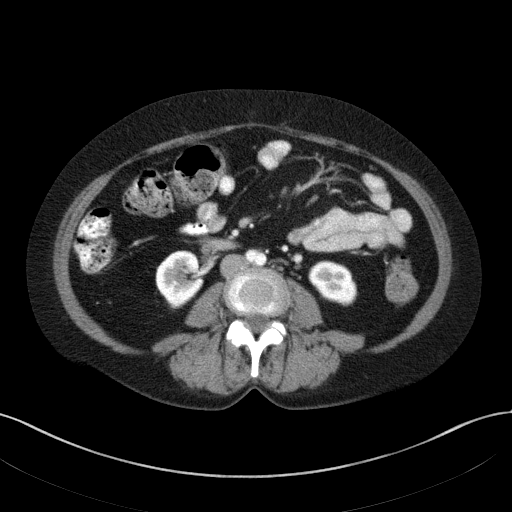
[im 55/95  soft-tissue]
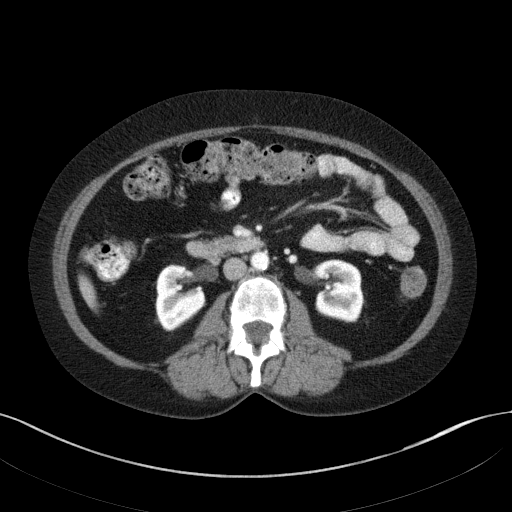
[im 60/95  soft-tissue]
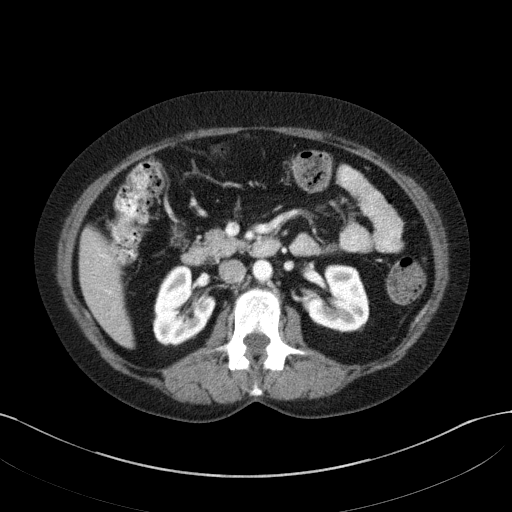
[im 60/95  bone]
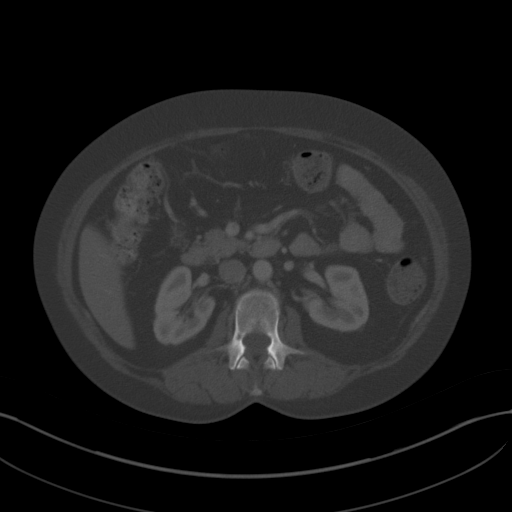
[im 70/95  soft-tissue]
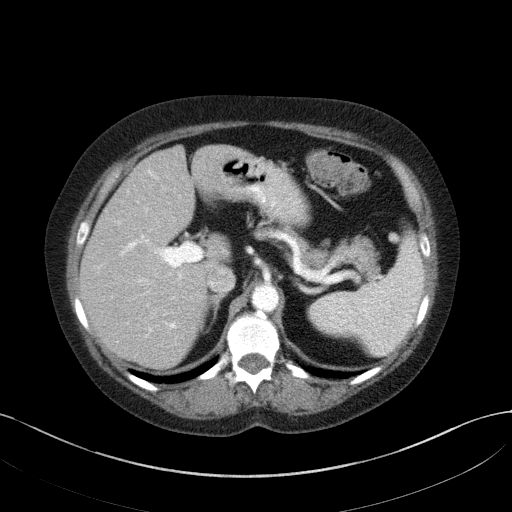
[im 75/95  soft-tissue]
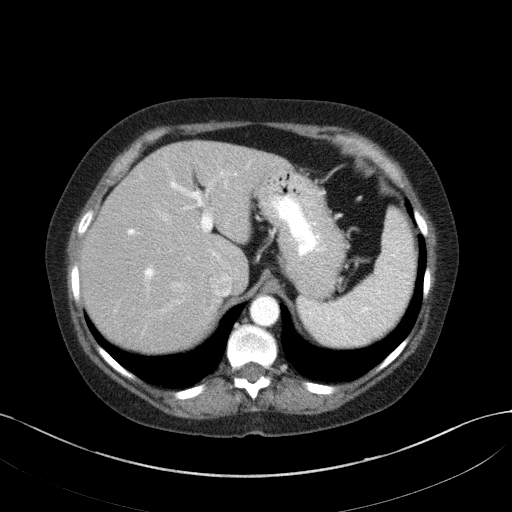
[im 80/95  soft-tissue]
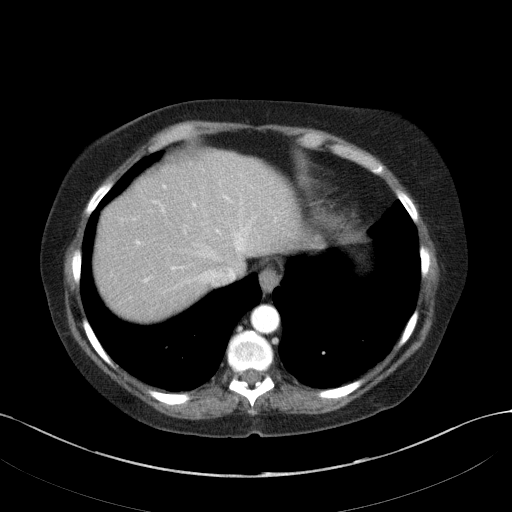
[im 90/95  soft-tissue]
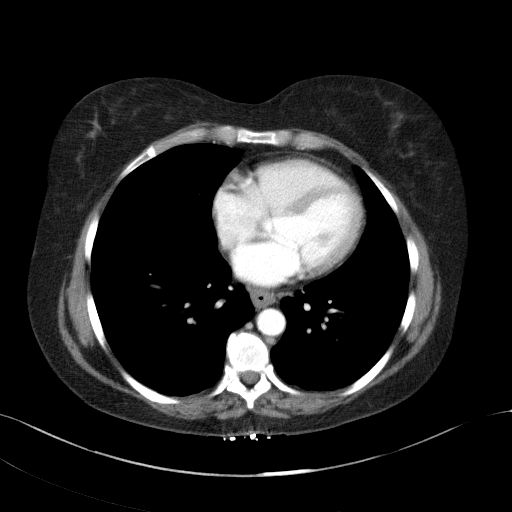

[Series 5: abd/pelvis 3.0 coronal · coronal · 0.69mm/px · 3 of 85 slices shown]
[im 29/85  soft-tissue]
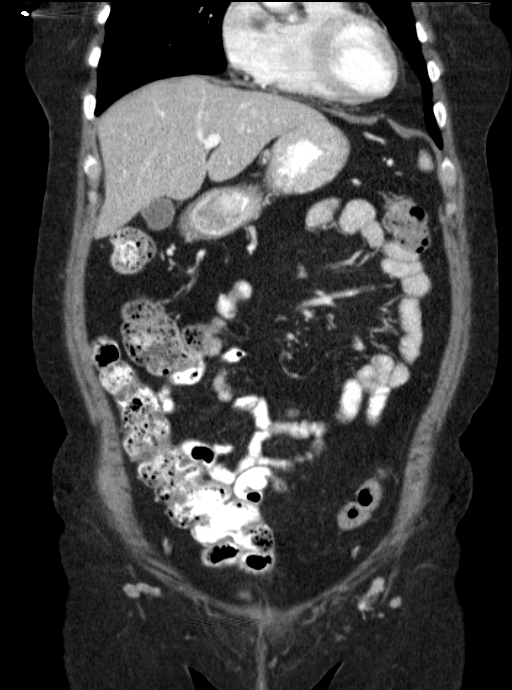
[im 38/85  soft-tissue]
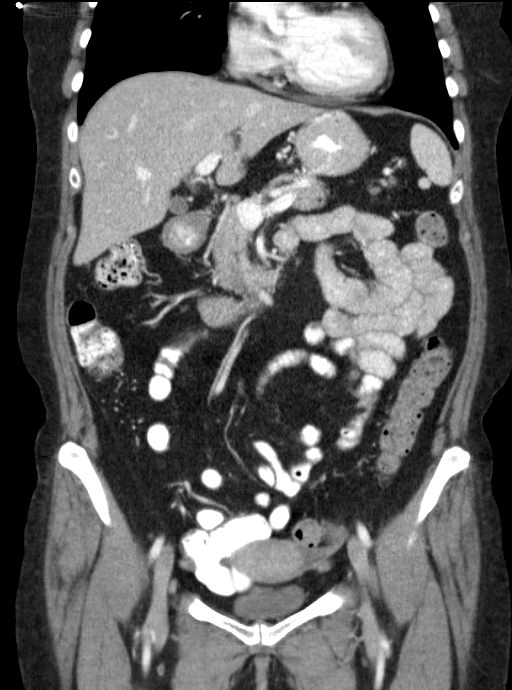
[im 47/85  soft-tissue]
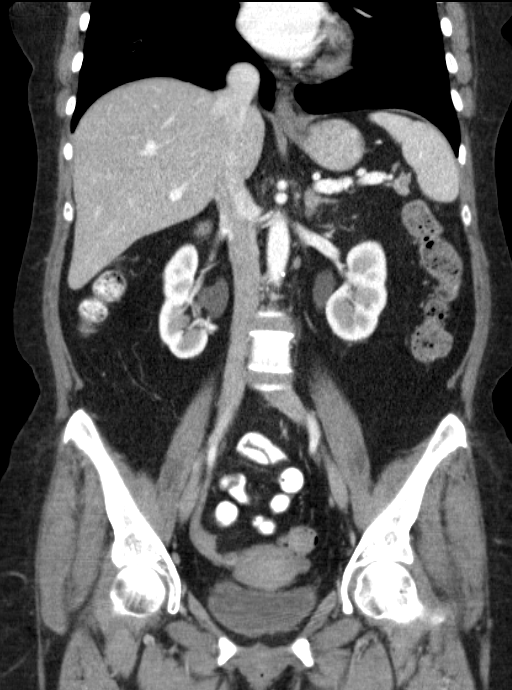

[16 of 46 positions shown; findings below may reference images not displayed]

FINDINGS: Negative lung bases, minimal dependent atelectasis. Borderline to
mild cardiomegaly. No pericardial or pleural effusion.

No acute osseous abnormality identified.

No pelvic free fluid.  Diminutive bladder.  Negative rectum.

Uterus and adnexa appear within normal limits. There seems to be
mild parametrial or sigmoid mesentery scarring (series 2, image 77)
which may be sequelae of prior inflammation. The appearance does not
suggest acute inflammatory stranding.

The sigmoid colon appears negative aside from retained stool.
Similar retained stool in the left colon, transverse and right
colon. Oral contrast has reached the hepatic flexure. The cecum is
located in the central lower pelvis. Negative terminal ileum.
Appendix not identified. No pericecal inflammation. No dilated small
bowel. Decompressed stomach and duodenum.

Mildly decreased density throughout the liver. Gallbladder, spleen,
pancreas, and adrenal glands are within normal limits. Portal venous
system appears normal. Major arterial structures in the abdomen and
pelvis are patent with minimal calcified atherosclerosis. No
abdominal free fluid. Bilateral renal enhancement and contrast
excretion are within normal limits. Mildly duplicated right renal
collecting system.

Abdominal and pelvic lymph nodes are within normal limits.
IMPRESSION: Largely unremarkable. No acute or inflammatory process identified in
the abdomen or pelvis.

## 2016-05-20 ENCOUNTER — Other Ambulatory Visit: Payer: Self-pay | Admitting: Family Medicine

## 2016-07-14 ENCOUNTER — Other Ambulatory Visit: Payer: Self-pay | Admitting: Family Medicine

## 2016-07-14 NOTE — Telephone Encounter (Signed)
Ok to give a 30 day supply with 1 rf but have not seen patient in more than a year so needs an appt for further prescriptions.

## 2016-09-05 ENCOUNTER — Other Ambulatory Visit: Payer: Self-pay | Admitting: Family Medicine

## 2016-09-07 ENCOUNTER — Other Ambulatory Visit: Payer: Self-pay | Admitting: Family Medicine
